# Patient Record
Sex: Male | Born: 1960
Health system: Southern US, Community
[De-identification: ages and names within clinical notes are randomized; demographics above are authoritative.]

## PROBLEM LIST (undated history)

## (undated) DIAGNOSIS — J449 Chronic obstructive pulmonary disease, unspecified: Secondary | ICD-10-CM

## (undated) DIAGNOSIS — C61 Malignant neoplasm of prostate: Secondary | ICD-10-CM

## (undated) DIAGNOSIS — Z720 Tobacco use: Secondary | ICD-10-CM

## (undated) DIAGNOSIS — I700 Atherosclerosis of aorta: Secondary | ICD-10-CM

## (undated) DIAGNOSIS — I2510 Atherosclerotic heart disease of native coronary artery without angina pectoris: Secondary | ICD-10-CM

## (undated) DIAGNOSIS — I10 Essential (primary) hypertension: Secondary | ICD-10-CM

## (undated) DIAGNOSIS — E785 Hyperlipidemia, unspecified: Secondary | ICD-10-CM

## (undated) DIAGNOSIS — C801 Malignant (primary) neoplasm, unspecified: Secondary | ICD-10-CM

## (undated) DIAGNOSIS — T7840XA Allergy, unspecified, initial encounter: Secondary | ICD-10-CM

## (undated) HISTORY — DX: Allergy, unspecified, initial encounter: T78.40XA

---

## 2008-12-30 ENCOUNTER — Emergency Department (HOSPITAL_COMMUNITY): Admission: EM | Admit: 2008-12-30 | Discharge: 2008-12-30 | Payer: Self-pay | Admitting: Family Medicine

## 2009-01-03 ENCOUNTER — Emergency Department (HOSPITAL_COMMUNITY): Admission: EM | Admit: 2009-01-03 | Discharge: 2009-01-03 | Payer: Self-pay | Admitting: Emergency Medicine

## 2009-11-22 ENCOUNTER — Emergency Department (HOSPITAL_COMMUNITY): Admission: EM | Admit: 2009-11-22 | Discharge: 2009-11-22 | Payer: Self-pay | Admitting: Emergency Medicine

## 2014-09-26 ENCOUNTER — Encounter (HOSPITAL_COMMUNITY): Payer: Self-pay | Admitting: Emergency Medicine

## 2014-09-26 ENCOUNTER — Emergency Department (INDEPENDENT_AMBULATORY_CARE_PROVIDER_SITE_OTHER)
Admission: EM | Admit: 2014-09-26 | Discharge: 2014-09-26 | Disposition: A | Discharge status: Home / Self Care | Payer: Self-pay | Source: Home / Self Care | Attending: Family Medicine | Admitting: Family Medicine

## 2014-09-26 DIAGNOSIS — M778 Other enthesopathies, not elsewhere classified: Secondary | ICD-10-CM

## 2014-09-26 DIAGNOSIS — T148XXA Other injury of unspecified body region, initial encounter: Secondary | ICD-10-CM

## 2014-09-26 DIAGNOSIS — M6530 Trigger finger, unspecified finger: Secondary | ICD-10-CM

## 2014-09-26 DIAGNOSIS — X503XXA Overexertion from repetitive movements, initial encounter: Secondary | ICD-10-CM

## 2014-09-26 DIAGNOSIS — T148 Other injury of unspecified body region: Secondary | ICD-10-CM

## 2014-09-26 MED ORDER — PREDNISONE 10 MG (48) PO TBPK: ORAL_TABLET | Freq: Every day | ORAL | Status: DC

## 2014-09-26 NOTE — ED Notes (Signed)
Pt states that his hand and wrist have been hurting and swollen for almost a week.

## 2014-09-26 NOTE — ED Provider Notes (Signed)
Austin Hawkins is a 54 y.o. male who presents to Urgent Care today for right hand and wrist pain. Patient notes a weeklong history of wrist pain and hand pain. 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 He notes his wrist pain is ulnar and worse with wrist extension and ulnar deviation. The hand pain is located at the third MCP pain is worse with flexion of his finger. He describes a popping sensation when he attempts to straighten his hand. He denies any radiating pain weakness or numbness. He denies any injury. He has tried ice and Advil which have helped only a little. No fevers or chills nausea vomiting or diarrhea.   History reviewed. No pertinent past medical history. History reviewed. No pertinent past surgical history. History  Substance Use Topics  . Smoking status: Current Every Day Smoker -- 1.00 packs/day    Types: Cigarettes  . Smokeless tobacco: Not on file  . Alcohol Use: Yes   ROS as above Medications: No current facility-administered medications for this encounter.   Current Outpatient Prescriptions  Medication Sig Dispense Refill  . predniSONE (STERAPRED UNI-PAK 48 TAB) 10 MG (48) TBPK tablet Take by mouth daily. 48 tablet 0   Allergies  Allergen Reactions  . Wool Alcohol [Lanolin]      Exam:  BP 125/90 mmHg  Pulse 65  Temp(Src) 98 F (36.7 C) (Oral)  Resp 16  SpO2 96% Gen: Well NAD HEENT: EOMI,  MMM Lungs: Normal work of breathing. CTABL Heart: RRR no MRG Abd: NABS, Soft. Nondistended, Nontender Exts: Brisk capillary refill, warm and well perfused.  Right arm:  Elbow nontender normal motion Wrist normal-appearing's with slight swelling at the ulnar aspect of the wrist. Tender palpation at the ulnar dorsal and volar wrist. Significant pain with wrist extension and ulnar deviation. Moderate pain with wrist flexion and ulnar deviation. No pain with wrist in plain ulnar deviation or radial deviation of the wrist. The snuffbox  is nontender. Hand is finger clubbing throughout Patient has triggering of the third digit with flexion of the PIP. Mild pain to palpation at the third volar MCP. Pulses and capillary refill and sensation are intact to the hand and wrist  Left arm: Elbow normal-appearing nontender normal motion Wrist normal-appearing nontender normal motion Hand clubbing of the fingers nontender normal motion pulses capillary refill sensation intact  No results found for this or any previous visit (from the past 24 hour(s)). No results found.  Assessment and Plan: 54 y.o. male with  1) trigger finger of the right third digit. Treat with a double Band-Aid splint rest and prednisone dose pack 2) tendinitis of the extensor carpi ulnaris likely: Treat with prednisone, thumb spica splint to prevent wrist motion, and attempted to change job duties at [employer] if able.  Follow-up with orthopedics as needed  Discussed warning signs or symptoms. Please see discharge instructions. Patient expresses understanding.     Gregor Hams, MD 09/26/14 [phone]

## 2014-09-26 NOTE — Discharge Instructions (Signed)
Thank you for coming in today.   Extensor Carpi Ulnaris Tendinitis with Rehab Tendonitis involves inflammation of a tendon, which is a soft tissue that connects muscle to bone. The Extensor Carpi Ulnaris (ECU) tendon is vulnerable to tendonitis. The ECU is responsible for straightening and rotating (abducting) the wrist. The ECU is important for gripping and pulling. ECU tendonitis may include inflammation of the ECU tendon lining (sheath). The tendon sheath usually secretes a fluid that allows the tendon to function smoothly, but when it becomes inflamed, function is impaired. This condition may also include a tear in the ECU tendon or muscle (strain). The strain may be classified as a grade 1 or 2 strain. Grade 1 strains cause pain, but the tendon is not lengthened. Grade 2 strains include a lengthened ligament, due to being stretched or partially ruptured. With grade 2 strains there is still function, although the function may be reduced. SYMPTOMS   Pain, tenderness, swelling, warmth, or redness on the little finger side of the wrist.  Pain that worsens when straightening the wrist or bending it toward the little finger.  Pain with gripping.  Limited motion of the wrist.  Crackling sound (crepitation) when the tendon or wrist is moved or touched. CAUSES  ECU tendonitis is caused by injury to the ECU tendon. It is usually due to chronic or repetitive injuries, but may be due to sharp (acute) injury. Common causes of injury include:  Strain from unusual use, overuse, or increase in activity, or change in activity of the wrist, hand, or forearm.  Direct hit (trauma) to the muscles and tendon on the side of the wrist.  Repetitive motions of the hand and wrist, due to friction of the tendon within the tendon lining. RISK INCREASES WITH:  Sports that involve repetitive hand and wrist motions (i.e. golfing, bowling).  Sports that require gripping (i.e. tennis, golf, weightlifting).  Heavy  labor.  Poor wrist and forearm strength and flexibility.  Failure to warm-up properly before activity. PREVENTION   Warm up and stretch properly before activity.  Allow the body to recover between activities.  Maintain physical fitness:  Strength, flexibility, and endurance.  Cardiovascular fitness.  Learn and use proper exercise technique. PROGNOSIS  If treated properly, ECU tendonitis is usually curable within 6 weeks.  RELATED COMPLICATIONS   Longer healing time, if not properly treated or if not given adequate time to heal.  Chronically inflamed tendon, causing persistent pain with activity. This may progress to constant pain, restriction of motion, and potentially tearing of the tendon.  Recurring symptoms, especially if activity is resumed too soon.  Risks of surgery: infection, bleeding, injury to nerves, continued pain, incomplete release of the tendon lining, recurring symptoms, cutting of the tendon, and weakness of the wrist and grip. TREATMENT  Treatment initially involves the use of ice and medicine, to help reduce pain and inflammation. Performing stretching and strengthening exercises regularly is important for a quick recovery. These exercises may be completed at home or with a therapist. Your caregiver may recommend the use of a brace or splint to reduce motions that aggravate symptoms. Corticosteroid injections may be recommended. If non-surgical treatment is unsuccessful, then surgery may be needed.  MEDICATION   If pain medicine is needed, nonsteroidal anti-inflammatory medicines (aspirin and ibuprofen), or other minor pain relievers (acetaminophen), are often recommended.  Do not take pain medicine for 7 days before surgery.  Prescription pain relievers may be given if your caregiver thinks they are needed. Use only as  directed and only as much as you need.  Corticosteroid injections may be given to reduce inflammation. However, these injections should be  reserved for serious cases, as they may only be given a certain number of times. COLD THERAPY  Cold treatment (icing) relieves pain and reduces inflammation. Cold treatment should be applied for 10 to 15 minutes every 2 to 3 hours, and immediately after activity that aggravates your symptoms. Use ice packs or an ice massage. SEEK MEDICAL CARE IF:   Symptoms get worse or do not improve in 2 weeks, despite treatment.  You experience pain, numbness, or coldness in the hand.  Blue, gray, or dark color appears in the fingernails.  Any of the following occur after surgery: increased pain, swelling, redness, drainage of fluids, bleeding in the surgical area, or signs of infection.  New, unexplained symptoms develop. (Drugs used in treatment may produce side effects.) EXERCISES RANGE OF MOTION (ROM) AND STRETCHING EXERCISES - Extensor Carpi Ulnaris Tendinitis These are some of the initial exercises you may start your recovery program with, until you see your caregiver again or until your symptoms are resolved. Remember:  Flexible tissue is more tolerant of the stresses placed on it during activity.  Each stretch should be held for 20 to 30 seconds.  A gentle stretching sensation should be felt. RANGE OF MOTION - Wrist Flexion  Hold your right / left wrist with the fingers pointing down toward the floor.  Pull down on the wrist until you feel a stretch.  Hold this position for __________ seconds. Repeat exercise __________ times, __________ times per day.  This exercise should be done with the elbow: _____ Bent to 90 degrees. _____ Straight. RANGE OF MOTION - Wrist Flexion  Place the back of your right / left hand flat on the top of a table. Your shoulder should be turned in and your fingers facing away from your body.  Press down, bending your wrist and straightening your elbow until your feel a stretch.  Hold this position for __________ seconds.  Repeat exercise __________ times,  __________ times per day. STRENGTHENING EXERCISES - Extensor Carpi Ulnaris Tendinitis These are some of the initial exercises you may start your recovery program with, until you see your caregiver again or until your symptoms are resolved. Remember:  Strong muscles with good endurance tolerate stress better.  Do the exercises as initially prescribed by your caregiver. Progress slowly with each exercise, gradually increasing the number of repetitions and weight used, only as guided. RANGE OF MOTION - Wrist Extensors  Sit or stand with your forearm supported.  Using a __________ weight or a piece of rubber band or tubing, bend your wrist slowly upward toward you.  Hold this position for __________ seconds and then slowly lower the wrist back to the starting position.  Repeat exercise __________ times, __________ times per day. RANGE OF MOTION - Wrist, Ulnar Deviation  Stand with a hammer in your hand, or sit while holding onto a rubber band or tubing with both hands, and with your arm supported on a table.  Raise your hand with the hammer upward behind you, or pull down on the rubber tubing.  Hold this position for __________ seconds and then slowly lower the wrist back to the starting position.  Repeat exercise __________ times, __________ times per day. STRENGTH - Grip  Hold a wad of putty, soft modeling clay, a large sponge, a soft rubber ball or a soft tennis ball in your hand.  Squeeze as  hard as you can.  Hold this position for __________ seconds.  Repeat exercise __________ times, __________ times per day. Document Released: 04/29/2005 Document Revised: 07/22/2011 Document Reviewed: 08/11/2008 [facility] Patient Information 2015 [place], [place]. This information is not intended to replace advice given to you by your health care provider. Make sure you discuss any questions you have with your health care provider.   Trigger Finger Trigger finger (digital tendinitis and  stenosing tenosynovitis) is a common disorder that causes an often painful catching of the fingers or thumb. It occurs as a clicking, snapping, or locking of a finger in the palm of the hand. This is caused by a problem with the tendons that flex or bend the fingers sliding smoothly through their sheaths. The condition may occur in any finger or a couple fingers at the same time.  The finger may lock with the finger curled or suddenly straighten out with a snap. This is more common in patients with rheumatoid arthritis and diabetes. Left untreated, the condition may get worse to the point where the finger becomes locked in flexion, like making a fist, or less commonly locked with the finger straightened out. CAUSES   Inflammation and scarring that lead to swelling around the tendon sheath.  Repeated or forceful movements.  Rheumatoid arthritis, an autoimmune disease that affects joints.  Gout.  Diabetes mellitus. SIGNS AND SYMPTOMS  Soreness and swelling of your finger.  A painful clicking or snapping as you bend and straighten your finger. DIAGNOSIS  Your health care provider will do a physical exam of your finger to diagnose trigger finger. TREATMENT   Splinting for 6-8 weeks may be helpful.  Nonsteroidal anti-inflammatory medicines (NSAIDs) can help to relieve the pain and inflammation.  Cortisone injections, along with splinting, may speed up recovery. Several injections may be required. Cortisone may give relief after one injection.  Surgery is another treatment that may be used if conservative treatments do not work. Surgery can be minor, without incisions (a cut does not have to be made), and can be done with a needle through the skin.  Other surgical choices involve an open procedure in which the surgeon opens the hand through a small incision and cuts the pulley so the tendon can again slide smoothly. Your hand will still work fine. HOME CARE INSTRUCTIONS  Apply ice to the  injured area, twice per day:  Put ice in a plastic bag.  Place a towel between your skin and the bag.  Leave the ice on for 20 minutes, 3-4 times a day.  Rest your hand often. MAKE SURE YOU:   Understand these instructions.  Will watch your condition.  Will get help right away if you are not doing well or get worse. Document Released: 02/17/2004 Document Revised: 12/30/2012 Document Reviewed: 09/29/2012 [facility] Patient Information 2015 [place], [place]. This information is not intended to replace advice given to you by your health care provider. Make sure you discuss any questions you have with your health care provider.

## 2015-08-29 ENCOUNTER — Ambulatory Visit (INDEPENDENT_AMBULATORY_CARE_PROVIDER_SITE_OTHER): Payer: BLUE CROSS/BLUE SHIELD | Admitting: Family Medicine

## 2015-08-29 VITALS — BP 126/82 | HR 86 | Temp 98.2°F | Resp 18 | Ht 67.0 in | Wt 122.0 lb

## 2015-08-29 DIAGNOSIS — Z0000 Encounter for general adult medical examination without abnormal findings: Secondary | ICD-10-CM

## 2015-08-29 DIAGNOSIS — Z23 Encounter for immunization: Secondary | ICD-10-CM

## 2015-08-29 DIAGNOSIS — T7840XA Allergy, unspecified, initial encounter: Secondary | ICD-10-CM | POA: Diagnosis not present

## 2015-08-29 LAB — POCT CBC
Granulocyte percent: 44.6 %G (ref 37–80)
HCT, POC: 35.3 % — AB (ref 43.5–53.7)
Hemoglobin: 11.9 g/dL — AB (ref 14.1–18.1)
Lymph, poc: 2.1 (ref 0.6–3.4)
MCH, POC: 29.6 pg (ref 27–31.2)
MCHC: 33.8 g/dL (ref 31.8–35.4)
MCV: 87.4 fL (ref 80–97)
MID (cbc): 0.6 (ref 0–0.9)
MPV: 7.3 fL (ref 0–99.8)
POC Granulocyte: 2.1 (ref 2–6.9)
POC LYMPH PERCENT: 43.6 %L (ref 10–50)
POC MID %: 11.8 %M (ref 0–12)
Platelet Count, POC: 252 10*3/uL (ref 142–424)
RBC: 4.03 M/uL — AB (ref 4.69–6.13)
RDW, POC: 14.1 %
WBC: 4.8 10*3/uL (ref 4.6–10.2)

## 2015-08-29 LAB — POCT URINALYSIS DIP (MANUAL ENTRY)
Bilirubin, UA: NEGATIVE
Glucose, UA: NEGATIVE
Leukocytes, UA: NEGATIVE
Nitrite, UA: NEGATIVE
Protein Ur, POC: 30 — AB
Spec Grav, UA: 1.025
Urobilinogen, UA: 0.2
pH, UA: 5.5

## 2015-08-29 MED ORDER — PREDNISONE 20 MG PO TABS: 20.0000 mg | ORAL_TABLET | Freq: Every day | ORAL | Status: DC | PRN

## 2015-08-29 NOTE — Patient Instructions (Addendum)

## 2015-08-29 NOTE — Progress Notes (Signed)
Patient ID: Austin Hawkins MRN: [id number], DOB: Apr 09, 1961 55 y.o. Date of Encounter: 08/29/2015, 5:24 PM  Primary Physician: No primary care provider on file.  Chief Complaint: Physical (CPE)  HPI: 55 y.o. y/o male with history noted below here for CPE.  He has an ex-wife and a former partner from which she has a son and a daughter. The son lives in [place] and the daughter in [place].   Plays basketball, rents a room. He's been working for [employer] for about 18 months. He occasionally has to deal with mineral wool which causes him to breakout, get itchy eyes. He has taken Benadryl which has helped somewhat. His company wants him to provide a letter stating that he can continue to work despite this allergy.  Patient has not had health insurance until recently. Needs to wait 6 more months before he qualifies for dental. He plans to then go to the dentist.  He smokes and is trying to quit.  Review of Systems: Consitutional: No fever, chills, fatigue, night sweats, lymphadenopathy, or weight changes. Eyes: No visual changes, eye redness, or discharge. ENT/Mouth: Ears: No otalgia, tinnitus, hearing loss, discharge. Nose: No congestion, rhinorrhea, sinus pain, or epistaxis. Throat: No sore throat, post nasal drip, or teeth pain. Cardiovascular: No CP, palpitations, diaphoresis, DOE, edema, orthopnea, PND. Respiratory: No cough, hemoptysis, SOB, or wheezing. Gastrointestinal: No anorexia, dysphagia, reflux, pain, nausea, vomiting, hematemesis, diarrhea, constipation, BRBPR, or melena. Genitourinary: No dysuria, frequency, urgency, hematuria, incontinence, nocturia, decreased urinary stream, discharge, impotence, or testicular pain/masses. Musculoskeletal: No decreased ROM, myalgias, stiffness, joint swelling, or weakness. Skin: No rash, erythema, lesion changes, pain, warmth, jaundice, or pruritis. Neurological: No headache, dizziness, syncope, seizures, tremors,  memory loss, coordination problems, or paresthesias. Psychological: No anxiety, depression, hallucinations, SI/HI. Endocrine: No fatigue, polydipsia, polyphagia, polyuria, or known diabetes. All other systems were reviewed and are otherwise negative.  History reviewed. No pertinent past medical history.   History reviewed. No pertinent past surgical history.  Home Meds:  Prior to Admission medications   Medication Sig Start Date End Date Taking? Authorizing Provider  predniSONE (STERAPRED UNI-PAK 48 TAB) 10 MG (48) TBPK tablet Take by mouth daily. Patient not taking: Reported on 08/29/2015 09/26/14   Gregor Hams, MD    Allergies:  Allergies  Allergen Reactions  . Wool Alcohol [Lanolin]     Social History   Social History  . Marital Status: Single    Spouse Name: N/A  . Number of Children: N/A  . Years of Education: N/A   Occupational History  . Not on file.   Social History Main Topics  . Smoking status: Current Every Day Smoker -- 1.00 packs/day    Types: Cigarettes  . Smokeless tobacco: Not on file  . Alcohol Use: Yes  . Drug Use: No  . Sexual Activity: Not on file   Other Topics Concern  . Not on file   Social History Narrative    History reviewed. No pertinent family history.  Physical Exam: Blood pressure 126/82, pulse 86, temperature 98.2 F (36.8 C), temperature source Oral, resp. rate 18, height 5\' 7"  (1.702 m), weight 122 lb (55.339 kg), SpO2 96 %.  BP Readings from Last 3 Encounters:  08/29/15 126/82  09/26/14 125/90   General: Well developed, well nourished, in no acute distress. HEENT: Normocephalic, atraumatic. Conjunctiva pink, sclera non-icteric. Pupils 2 mm constricting to 1 mm, round, regular, and equally reactive to light and accomodation. EOMI.  Fundi benign   Internal auditory  canal clear. TMs with good cone of light and without pathology. Nasal mucosa pink. Nares are without discharge. No sinus tenderness. Oral mucosa pink. Dentition  fair--> receding gums . Pharynx without exudate.    Neck: Supple. Trachea midline. No thyromegaly. Full ROM. No lymphadenopathy. Lungs: Clear to auscultation bilaterally without wheezes, rales, or rhonchi. Breathing is of normal effort and unlabored. Cardiovascular: RRR with S1 S2. No murmurs, rubs, or gallops appreciated. Distal pulses 2+ symmetrically. No carotid or abdominal bruits Abdomen: Soft, non-tender, non-distended with normoactive bowel sounds. No hepatosplenomegaly or masses. No rebound/guarding. No CVA tenderness. Without hernias.   Genitourinary:  circumcised male. No penile lesions. Testes descended bilaterally, and smooth without tenderness or masses.  Musculoskeletal: Full range of motion and 5/5 strength throughout. Without swelling, atrophy, tenderness, crepitus, or warmth. Extremities without clubbing, cyanosis, or edema. Calves supple. Skin: Warm and moist without erythema, ecchymosis, wounds, or rash. Neuro: A+Ox3. CN II-XII grossly intact. Moves all extremities spontaneously. Full sensation throughout. Normal gait. DTR 2+ throughout upper and lower extremities. Finger to nose intact. Psych:  Responds to questions appropriately with a normal affect.   No results found for: CHOL No results found for: HDL No results found for: LDLCALC No results found for: TRIG No results found for: CHOLHDL No results found for: LDLDIRECT  Assessment/Plan:  55 y.o. y/o  male here for CPE   ICD-9-CM ICD-10-CM   1. Annual physical exam V70.0 Z00.00 Ambulatory referral to Gastroenterology     Lipid panel     Comprehensive metabolic panel     POCT urinalysis dipstick     POCT CBC     PSA     EKG 12-Lead  2. Allergy, initial encounter 995.3 T78.40XA predniSONE (DELTASONE) 20 MG tablet    Signed, Robyn Haber, MD 08/29/2015 5:24 PM

## 2015-08-30 LAB — COMPREHENSIVE METABOLIC PANEL WITH GFR
ALT: 18 U/L (ref 9–46)
AST: 31 U/L (ref 10–35)
Albumin: 4.5 g/dL (ref 3.6–5.1)
Alkaline Phosphatase: 42 U/L (ref 40–115)
BUN: 12 mg/dL (ref 7–25)
CO2: 22 mmol/L (ref 20–31)
Calcium: 9.1 mg/dL (ref 8.6–10.3)
Chloride: 102 mmol/L (ref 98–110)
Creat: 1.06 mg/dL (ref 0.70–1.33)
Glucose, Bld: 111 mg/dL — ABNORMAL HIGH (ref 65–99)
Potassium: 4.4 mmol/L (ref 3.5–5.3)
Sodium: 137 mmol/L (ref 135–146)
Total Bilirubin: 0.5 mg/dL (ref 0.2–1.2)
Total Protein: 8 g/dL (ref 6.1–8.1)

## 2015-08-30 LAB — LIPID PANEL
Cholesterol: 157 mg/dL (ref 125–200)
HDL: 69 mg/dL (ref >=40)
LDL Cholesterol: 64 mg/dL (ref <130)
Total CHOL/HDL Ratio: 2.3 Ratio (ref <=5.0)
Triglycerides: 122 mg/dL (ref <150)
VLDL: 24 mg/dL (ref <30)

## 2015-08-31 ENCOUNTER — Other Ambulatory Visit: Payer: Self-pay | Admitting: Family Medicine

## 2015-08-31 DIAGNOSIS — R972 Elevated prostate specific antigen [PSA]: Secondary | ICD-10-CM

## 2015-08-31 LAB — PSA: PSA: 17.67 ng/mL — ABNORMAL HIGH (ref <=4.00)

## 2016-08-08 ENCOUNTER — Ambulatory Visit (INDEPENDENT_AMBULATORY_CARE_PROVIDER_SITE_OTHER): Payer: 59

## 2016-08-08 ENCOUNTER — Ambulatory Visit (INDEPENDENT_AMBULATORY_CARE_PROVIDER_SITE_OTHER): Payer: 59 | Admitting: Emergency Medicine

## 2016-08-08 VITALS — BP 127/77 | HR 89 | Temp 98.8°F | Resp 18 | Ht 66.54 in | Wt 123.8 lb

## 2016-08-08 DIAGNOSIS — M25561 Pain in right knee: Secondary | ICD-10-CM | POA: Diagnosis not present

## 2016-08-08 DIAGNOSIS — Z23 Encounter for immunization: Secondary | ICD-10-CM

## 2016-08-08 DIAGNOSIS — M25461 Effusion, right knee: Secondary | ICD-10-CM | POA: Diagnosis not present

## 2016-08-08 DIAGNOSIS — M7051 Other bursitis of knee, right knee: Secondary | ICD-10-CM

## 2016-08-08 MED ORDER — DICLOFENAC SODIUM 75 MG PO TBEC
75.0000 mg | DELAYED_RELEASE_TABLET | Freq: Two times a day (BID) | ORAL | 0 refills | Status: AC
Start: 2016-08-08 — End: 2016-08-13

## 2016-08-08 MED ORDER — PREDNISONE 20 MG PO TABS: 40.0000 mg | ORAL_TABLET | Freq: Every day | ORAL | 0 refills | Status: AC

## 2016-08-08 NOTE — Progress Notes (Signed)
Austin Hawkins 56 y.o.   Chief Complaint  Patient presents with  . Knee Pain    swollen right knee x 2 weeks   . Flu Vaccine    HISTORY OF PRESENT ILLNESS: This is a 56 y.o. male complaining of right knee swelling x 2 weeks; denies injury.  Knee Pain   The incident occurred more than 1 week ago. There was no injury mechanism. The pain is present in the right knee. The quality of the pain is described as aching. The pain is at a severity of 6/10. The pain is moderate. The pain has been fluctuating since onset. Pertinent negatives include no loss of motion, loss of sensation, muscle weakness, numbness or tingling. Associated symptoms comments: Painful to bear weight.. The symptoms are aggravated by weight bearing and movement.     Prior to Admission medications   Medication Sig Start Date End Date Taking? Authorizing Provider  diclofenac (VOLTAREN) 75 MG EC tablet Take 1 tablet (75 mg total) by mouth 2 (two) times daily. 08/08/16 08/13/16  Horald Pollen, MD  predniSONE (DELTASONE) 20 MG tablet Take 2 tablets (40 mg total) by mouth daily with breakfast. 08/08/16 08/13/16  Horald Pollen, MD    Allergies  Allergen Reactions  . Wool Alcohol [Lanolin]     There are no active problems to display for this patient.   No past medical history on file.  No past surgical history on file.  Social History   Social History  . Marital status: Single    Spouse name: N/A  . Number of children: N/A  . Years of education: N/A   Occupational History  . Not on file.   Social History Main Topics  . Smoking status: Current Every Day Smoker    Packs/day: 1.00    Types: Cigarettes  . Smokeless tobacco: Never Used  . Alcohol use Yes  . Drug use: No  . Sexual activity: Not on file   Other Topics Concern  . Not on file   Social History Narrative  . No narrative on file    No family history on file.   Review of Systems  Constitutional: Negative for chills and fever.    HENT: Negative.   Eyes: Negative.   Respiratory: Negative for shortness of breath.   Cardiovascular: Negative for chest pain, claudication and leg swelling.  Gastrointestinal: Negative for abdominal pain, nausea and vomiting.  Genitourinary: Negative for dysuria and hematuria.  Musculoskeletal: Positive for joint pain (right knee).  Skin: Negative for rash.  Neurological: Negative for dizziness, tingling, sensory change, focal weakness and numbness.  Endo/Heme/Allergies: Negative.   All other systems reviewed and are negative.  Vitals:   08/08/16 1714  BP: 127/77  Pulse: 89  Resp: 18  Temp: 98.8 F (37.1 C)     Physical Exam  Constitutional: He is oriented to person, place, and time. He appears well-developed and well-nourished.  HENT:  Head: Normocephalic and atraumatic.  Eyes: EOM are normal. Pupils are equal, round, and reactive to light.  Neck: Normal range of motion. Neck supple.  Cardiovascular: Normal rate and regular rhythm.   Pulmonary/Chest: Effort normal and breath sounds normal.  Abdominal: Soft. There is no tenderness.  Musculoskeletal:       Right knee: He exhibits swelling and effusion. He exhibits normal range of motion, no ecchymosis, no deformity, no erythema, normal alignment, no LCL laxity, normal patellar mobility and no bony tenderness. No tenderness found.  Neurological: He is alert and oriented to person,  place, and time. He displays normal reflexes. No sensory deficit. He exhibits normal muscle tone.  Skin: Skin is warm and dry. Capillary refill takes less than 2 seconds.  Psychiatric: He has a normal mood and affect. His behavior is normal.  Vitals reviewed.  Xray reviewed by me: no bony abnormality.  ASSESSMENT & PLAN: Austin Hawkins was seen today for knee pain and flu vaccine.  Diagnoses and all orders for this visit:  Acute pain of right knee -     DG Knee 1-2 Views Right; Future -     Ambulatory referral to Orthopedic Surgery  Bursitis of  right knee, unspecified bursa -     Ambulatory referral to Orthopedic Surgery  Other orders -     diclofenac (VOLTAREN) 75 MG EC tablet; Take 1 tablet (75 mg total) by mouth 2 (two) times daily. -     predniSONE (DELTASONE) 20 MG tablet; Take 2 tablets (40 mg total) by mouth daily with breakfast.     Patient Instructions       IF you received an x-ray today, you will receive an invoice from [facility]. Please contact [facility] at [phone] with questions or concerns regarding your invoice.   IF you received labwork today, you will receive an invoice from [facility]. Please contact [facility] at [phone] with questions or concerns regarding your invoice.   Our billing staff will not be able to assist you with questions regarding bills from these companies.  You will be contacted with the lab results as soon as they are available. The fastest way to get your results is to activate your My Chart account. Instructions are located on the last page of this paperwork. If you have not heard from [place] regarding the results in 2 weeks, please contact this office.      Pes Anserine Bursitis The pes anserine is an area on the inside of your knee, just below the joint, which is cushioned by a fluid-filled sac (bursa). Pes anserine bursitis is a condition that happens when this bursa gets swollen and irritated. The condition causes knee pain. What are the causes? This condition may be caused by:  Making the same movement over and over.  A hit to the inside of the leg. What increases the risk? This injury is most likely to develop in:  Runners.  Athletes who play sports that involve a lot of running and quick side-to-side movements (cutting).  Athletes who play contact sports.  People who swim using an inward angle of the knee, such as with the breaststroke.  People with tight hamstring muscles.  Females.  People who are overweight.  People with flat  feet.  People who have diabetes or osteoarthritis. What are the signs or symptoms? Symptoms of this condition include:  Knee pain that gets better with rest and worse with activities like climbing stairs, walking, running, or getting in and out of a chair (common).  Swelling.  Warmth.  Tenderness when pressing at the inside of the lower leg, just below the knee. How is this diagnosed? This condition may be diagnosed based on:  Your symptoms.  Your medical history.  A physical exam.  Tests, such as:  X-rays.  MRI and ultrasound. These tests are done to check for swelling and fluid buildup in the bursa and to look at muscles and tendons. During your physical exam, your health care provider will press on the tendon attachment to see if you feel pain. He or she may also check your  hip and knee motion and strength. How is this treated? This condition may be treated by:  Resting your knee.  Avoiding activities that cause pain.  Icing the inside of your knee.  Raising (elevating) your knee while resting.  Sleeping with a pillow between your knees. This will cushion your injured knee.  Taking medicine to reduce pain and swelling.  Having medicines injected into your knee.  Doing strengthening and stretching exercises (physical therapy). If these treatments do not work or if the condition keeps coming back, you may need to have surgery to remove the bursa. Follow these instructions at home: Managing pain, stiffness, and swelling   If directed, apply ice to your knee.  Put ice in a plastic bag.  Place a towel between your skin and the bag.  Leave the ice on for 20 minutes, 2-3 times a day.  While you are sitting, elevate your knee.  While you are lying down, elevate your knee above the level of your heart.  Take over-the-counter and prescription medicines only as told by your health care provider. Activity   Return to your normal activities as told by your  health care provider. Ask your health care provider what activities are safe for you.  Do exercises as told by your health care provider. General instructions   Sleep with a pillow between your knees.  Do not use any tobacco products, such as cigarettes, chewing tobacco, and e-cigarettes. Tobacco can delay healing. If you need help quitting, ask your health care provider.  Keep all follow-up visits as told by your health care provider. This is important. How is this prevented?  Warm up and stretch before being active.  Cool down and stretch after being active.  Give your body time to rest between periods of activity.  Make sure to use equipment that fits you.  Be safe and responsible while being active to avoid falls.  Do at least 150 minutes of moderate-intensity exercise each week, such as brisk walking or water aerobics.  Maintain physical fitness, including:  Strength.  Flexibility.  Cardiovascular fitness.  Endurance. Contact a health care provider if:  Your symptoms do not improve.  Your symptoms get worse. This information is not intended to replace advice given to you by your health care provider. Make sure you discuss any questions you have with your health care provider. Document Released: 04/29/2005 Document Revised: 01/02/2016 Document Reviewed: 04/14/2015 Elsevier Interactive Patient Education  2017 Elsevier Inc.      Agustina Caroli, MD [facility]

## 2016-08-08 NOTE — Patient Instructions (Addendum)
IF you received an x-ray today, you will receive an invoice from [facility]. Please contact [facility] at [phone] with questions or concerns regarding your invoice.   IF you received labwork today, you will receive an invoice from [facility]. Please contact [facility] at [phone] with questions or concerns regarding your invoice.   Our billing staff will not be able to assist you with questions regarding bills from these companies.  You will be contacted with the lab results as soon as they are available. The fastest way to get your results is to activate your My Chart account. Instructions are located on the last page of this paperwork. If you have not heard from [place] regarding the results in 2 weeks, please contact this office.      Pes Anserine Bursitis The pes anserine is an area on the inside of your knee, just below the joint, which is cushioned by a fluid-filled sac (bursa). Pes anserine bursitis is a condition that happens when this bursa gets swollen and irritated. The condition causes knee pain. What are the causes? This condition may be caused by:  Making the same movement over and over.  A hit to the inside of the leg. What increases the risk? This injury is most likely to develop in:  Runners.  Athletes who play sports that involve a lot of running and quick side-to-side movements (cutting).  Athletes who play contact sports.  People who swim using an inward angle of the knee, such as with the breaststroke.  People with tight hamstring muscles.  Females.  People who are overweight.  People with flat feet.  People who have diabetes or osteoarthritis. What are the signs or symptoms? Symptoms of this condition include:  Knee pain that gets better with rest and worse with activities like climbing stairs, walking, running, or getting in and out of a chair (common).  Swelling.  Warmth.  Tenderness when pressing at the inside of the  lower leg, just below the knee. How is this diagnosed? This condition may be diagnosed based on:  Your symptoms.  Your medical history.  A physical exam.  Tests, such as:  X-rays.  MRI and ultrasound. These tests are done to check for swelling and fluid buildup in the bursa and to look at muscles and tendons. During your physical exam, your health care provider will press on the tendon attachment to see if you feel pain. He or she may also check your hip and knee motion and strength. How is this treated? This condition may be treated by:  Resting your knee.  Avoiding activities that cause pain.  Icing the inside of your knee.  Raising (elevating) your knee while resting.  Sleeping with a pillow between your knees. This will cushion your injured knee.  Taking medicine to reduce pain and swelling.  Having medicines injected into your knee.  Doing strengthening and stretching exercises (physical therapy). If these treatments do not work or if the condition keeps coming back, you may need to have surgery to remove the bursa. Follow these instructions at home: Managing pain, stiffness, and swelling   If directed, apply ice to your knee.  Put ice in a plastic bag.  Place a towel between your skin and the bag.  Leave the ice on for 20 minutes, 2-3 times a day.  While you are sitting, elevate your knee.  While you are lying down, elevate your knee above the level of your heart.  Take over-the-counter and prescription  medicines only as told by your health care provider. Activity   Return to your normal activities as told by your health care provider. Ask your health care provider what activities are safe for you.  Do exercises as told by your health care provider. General instructions   Sleep with a pillow between your knees.  Do not use any tobacco products, such as cigarettes, chewing tobacco, and e-cigarettes. Tobacco can delay healing. If you need help quitting,  ask your health care provider.  Keep all follow-up visits as told by your health care provider. This is important. How is this prevented?  Warm up and stretch before being active.  Cool down and stretch after being active.  Give your body time to rest between periods of activity.  Make sure to use equipment that fits you.  Be safe and responsible while being active to avoid falls.  Do at least 150 minutes of moderate-intensity exercise each week, such as brisk walking or water aerobics.  Maintain physical fitness, including:  Strength.  Flexibility.  Cardiovascular fitness.  Endurance. Contact a health care provider if:  Your symptoms do not improve.  Your symptoms get worse. This information is not intended to replace advice given to you by your health care provider. Make sure you discuss any questions you have with your health care provider. Document Released: 04/29/2005 Document Revised: 01/02/2016 Document Reviewed: 04/14/2015 Elsevier Interactive Patient Education  2017 [employer].

## 2016-08-08 NOTE — Addendum Note (Signed)
Addended by: Nickolas Madrid on: 08/08/2016 06:39 PM   Modules accepted: Orders

## 2016-08-20 ENCOUNTER — Ambulatory Visit (INDEPENDENT_AMBULATORY_CARE_PROVIDER_SITE_OTHER): Payer: 59 | Admitting: Student

## 2016-08-20 VITALS — BP 138/78 | HR 77 | Temp 98.9°F | Resp 16 | Ht 67.0 in | Wt 128.0 lb

## 2016-08-20 DIAGNOSIS — R3589 Other polyuria: Secondary | ICD-10-CM | POA: Insufficient documentation

## 2016-08-20 DIAGNOSIS — R972 Elevated prostate specific antigen [PSA]: Secondary | ICD-10-CM | POA: Insufficient documentation

## 2016-08-20 DIAGNOSIS — Z0000 Encounter for general adult medical examination without abnormal findings: Secondary | ICD-10-CM | POA: Diagnosis not present

## 2016-08-20 DIAGNOSIS — Z114 Encounter for screening for human immunodeficiency virus [HIV]: Secondary | ICD-10-CM | POA: Insufficient documentation

## 2016-08-20 DIAGNOSIS — Z113 Encounter for screening for infections with a predominantly sexual mode of transmission: Secondary | ICD-10-CM | POA: Insufficient documentation

## 2016-08-20 DIAGNOSIS — R358 Other polyuria: Secondary | ICD-10-CM | POA: Diagnosis not present

## 2016-08-20 DIAGNOSIS — N029 Recurrent and persistent hematuria with unspecified morphologic changes: Secondary | ICD-10-CM | POA: Diagnosis not present

## 2016-08-20 DIAGNOSIS — Z1211 Encounter for screening for malignant neoplasm of colon: Secondary | ICD-10-CM

## 2016-08-20 DIAGNOSIS — R630 Anorexia: Secondary | ICD-10-CM | POA: Diagnosis not present

## 2016-08-20 LAB — POCT URINALYSIS DIP (MANUAL ENTRY)
BILIRUBIN UA: NEGATIVE
GLUCOSE UA: NEGATIVE mg/dL
Leukocytes, UA: NEGATIVE — AB
NITRITE UA: NEGATIVE
Protein Ur, POC: NEGATIVE mg/dL
Spec Grav, UA: 1.02 (ref 1.010–1.025)
Urobilinogen, UA: 1 E.U./dL
pH, UA: 6 (ref 5.0–8.0)

## 2016-08-20 LAB — IFOBT (OCCULT BLOOD): IFOBT: NEGATIVE

## 2016-08-20 NOTE — Assessment & Plan Note (Signed)
Referral for urology.  Blood in urine twice, referral to urology for bladder cancer workup.

## 2016-08-20 NOTE — Assessment & Plan Note (Addendum)
Needs recheck PSA and will refer to urology. Expressed concern for prostate cancer.

## 2016-08-20 NOTE — Progress Notes (Signed)
Subjective:    Patient ID: Austin Hawkins, male    DOB: 1960/05/19, 56 y.o.   MRN: [id number]  HPI Presents for annual exam.  He has no chief complaints today.  Does state that he does urinate frequently.  No hesitancy, urgency, dysuria, hematuria.  Denies melena or hematochezia.  No penile discharge.  No fevers, chills, weight changes.  +smoking history for 40 years.  Sexually active, no symptoms of STD's. Would like screening.  Currently working, enjoys work.  Had an elevated PSA last year, but did not have a phone so he could not get results.  Drinks 1 glass of alcohol per day.    Was seen a couple weeks ago for right knee pain and swelling. He was giving prednisone and diclofenac with some relief.  Reports catching and buckling.  He denies any injury.  He has an appt with ortho in a couple weeks.  Tetanus shot 08/2015. Needs colonoscopy.  PMHx - Updated and reviewed.  Contributory factors include: Negative PSHx - Updated and reviewed.  Contributory factors include:  Negative FHx - Updated and reviewed.  Contributory factors include:  h/o stroke, CAD Social Hx - Updated and reviewed. Contributory factors include: Has a history  Medications - reviewed    Review of Systems  Constitutional: Negative for chills, fatigue and fever.  HENT: Negative for congestion, ear discharge, ear pain, rhinorrhea, sneezing, sore throat and voice change.   Eyes: Negative for pain and itching.  Respiratory: Negative for cough, shortness of breath, wheezing and stridor.   Cardiovascular: Negative for chest pain, palpitations and leg swelling.  Gastrointestinal: Negative for abdominal pain, blood in stool, constipation, diarrhea and nausea.  Endocrine: Positive for polyuria. Negative for cold intolerance, heat intolerance, polydipsia and polyphagia.  Genitourinary: Negative for discharge, dysuria, hematuria, penile pain and urgency.  Musculoskeletal: Negative for joint swelling and myalgias.  Skin: Negative  for pallor, rash and wound.  Neurological: Negative for dizziness and weakness.  Hematological: Negative for adenopathy. Does not bruise/bleed easily.  Psychiatric/Behavioral: Negative for behavioral problems, dysphoric mood, sleep disturbance and suicidal ideas. The patient is not nervous/anxious.   All other systems reviewed and are negative.      Objective:   Physical Exam  Constitutional: He is oriented to person, place, and time. He appears well-developed and well-nourished. No distress.  HENT:  Head: Normocephalic and atraumatic.  Right Ear: External ear normal.  Left Ear: External ear normal.  Nose: Nose normal.  Mouth/Throat: Oropharynx is clear and moist. No oropharyngeal exudate.  Eyes: Conjunctivae are normal. No scleral icterus.  Neck: Normal range of motion. Neck supple.  Cardiovascular: Normal rate, regular rhythm, normal heart sounds and intact distal pulses.  Exam reveals no gallop and no friction rub.   No murmur heard. Pulmonary/Chest: Effort normal and breath sounds normal. No respiratory distress. He has no wheezes. He has no rales.  Abdominal: Soft. Bowel sounds are normal. He exhibits no distension and no mass. There is no tenderness. There is no guarding.  Genitourinary:  Genitourinary Comments: Has small external hemorrhoids No bleeding seen Prostate with hypertrophy, no nodules palpated  Musculoskeletal: He exhibits no edema or deformity.  Lymphadenopathy:    He has no cervical adenopathy.  Neurological: He is alert and oriented to person, place, and time.  Skin: Skin is warm. No rash noted. He is not diaphoretic. No erythema. No pallor.  Psychiatric: He has a normal mood and affect. His behavior is normal. Judgment and thought content normal.   BP 138/78  Pulse 77   Temp 98.9 F (37.2 C) (Oral)   Resp 16   Ht 5\' 7"  (1.702 m)   Wt 128 lb (58.1 kg)   SpO2 99%   BMI 20.05 kg/m         Assessment & Plan:  Elevated PSA, between 10 and less than  20 ng/ml Needs recheck PSA and will refer to urology.  Polyuria Will screen DM, BMP.  Rechecking PSA.  Idiopathic hematuria Referral for urology.  Blood in urine twice, referral to urology for bladder cancer workup.  Screen for STD (sexually transmitted disease) Would like screening for STD's.  Signed,  Balinda Quails, DO [facility] 8:55 PM

## 2016-08-20 NOTE — Patient Instructions (Signed)
     IF you received an x-ray today, you will receive an invoice from [facility]. Please contact [facility] at [phone] with questions or concerns regarding your invoice.   IF you received labwork today, you will receive an invoice from [facility]. Please contact [facility] at [phone] with questions or concerns regarding your invoice.   Our billing staff will not be able to assist you with questions regarding bills from these companies.  You will be contacted with the lab results as soon as they are available. The fastest way to get your results is to activate your My Chart account. Instructions are located on the last page of this paperwork. If you have not heard from us regarding the results in 2 weeks, please contact this office.     

## 2016-08-20 NOTE — Assessment & Plan Note (Signed)
Will screen DM, BMP.  Rechecking PSA.

## 2016-08-20 NOTE — Assessment & Plan Note (Signed)
Would like screening for STD's.

## 2016-08-21 ENCOUNTER — Telehealth: Payer: Self-pay | Admitting: Student

## 2016-08-21 LAB — CBC WITH DIFFERENTIAL/PLATELET
BASOS ABS: 0 10*3/uL (ref 0.0–0.2)
BASOS: 1 %
EOS (ABSOLUTE): 0.1 10*3/uL (ref 0.0–0.4)
Eos: 2 %
HEMATOCRIT: 38.2 % (ref 37.5–51.0)
Hemoglobin: 12.2 g/dL — ABNORMAL LOW (ref 13.0–17.7)
IMMATURE GRANS (ABS): 0 10*3/uL (ref 0.0–0.1)
Immature Granulocytes: 0 %
LYMPHS ABS: 1.6 10*3/uL (ref 0.7–3.1)
LYMPHS: 34 %
MCH: 28.7 pg (ref 26.6–33.0)
MCHC: 31.9 g/dL (ref 31.5–35.7)
MCV: 90 fL (ref 79–97)
MONOS ABS: 1.1 10*3/uL — AB (ref 0.1–0.9)
Monocytes: 23 %
NEUTROS ABS: 1.8 10*3/uL (ref 1.4–7.0)
Neutrophils: 40 %
PLATELETS: 350 10*3/uL (ref 150–379)
RBC: 4.25 x10E6/uL (ref 4.14–5.80)
RDW: 13.8 % (ref 12.3–15.4)
WBC: 4.7 10*3/uL (ref 3.4–10.8)

## 2016-08-21 LAB — BASIC METABOLIC PANEL
BUN / CREAT RATIO: 14 (ref 9–20)
BUN: 15 mg/dL (ref 6–24)
CO2: 25 mmol/L (ref 18–29)
CREATININE: 1.07 mg/dL (ref 0.76–1.27)
Calcium: 9.3 mg/dL (ref 8.7–10.2)
Chloride: 101 mmol/L (ref 96–106)
GFR calc non Af Amer: 77 mL/min/{1.73_m2} (ref >59)
GFR, EST AFRICAN AMERICAN: 89 mL/min/{1.73_m2} (ref >59)
Glucose: 90 mg/dL (ref 65–99)
Potassium: 4.5 mmol/L (ref 3.5–5.2)
Sodium: 141 mmol/L (ref 134–144)

## 2016-08-21 LAB — PSA: Prostate Specific Ag, Serum: 19.2 ng/mL — ABNORMAL HIGH (ref 0.0–4.0)

## 2016-08-21 LAB — RPR: RPR Ser Ql: NONREACTIVE

## 2016-08-21 LAB — GC/CHLAMYDIA PROBE AMP
CHLAMYDIA, DNA PROBE: NEGATIVE
Neisseria gonorrhoeae by PCR: NEGATIVE

## 2016-08-21 LAB — HEMOGLOBIN A1C
Est. average glucose Bld gHb Est-mCnc: 100 mg/dL
Hgb A1c MFr Bld: 5.1 % (ref 4.8–5.6)

## 2016-08-21 LAB — HIV ANTIBODY (ROUTINE TESTING W REFLEX): HIV Screen 4th Generation wRfx: NONREACTIVE

## 2016-08-21 LAB — TSH: TSH: 1.53 u[IU]/mL (ref 0.450–4.500)

## 2016-08-21 LAB — HEPATITIS B SURFACE ANTIGEN: Hepatitis B Surface Ag: NEGATIVE

## 2016-08-21 NOTE — Telephone Encounter (Signed)
Called pt and left message that labs were as expected.  Can call back if would like to go over in further detail.  Only significant lab abnormality was the increase in PSA, but this was reviewed during his office visit that it is imperative to see urologist.  Concerned for prostate CA.  Signed,  Balinda Quails, DO [facility] 1:20 PM

## 2016-08-23 DIAGNOSIS — R972 Elevated prostate specific antigen [PSA]: Secondary | ICD-10-CM | POA: Diagnosis not present

## 2016-08-28 ENCOUNTER — Telehealth: Payer: Self-pay | Admitting: Student

## 2016-08-28 NOTE — Telephone Encounter (Signed)
Pt would like Dr Drema Dallas to know that he was seen by the provider at [facility] and they will be doing a follow up procedure on May 25th to check for prostate cancer.

## 2016-09-04 ENCOUNTER — Ambulatory Visit (INDEPENDENT_AMBULATORY_CARE_PROVIDER_SITE_OTHER): Payer: 59 | Admitting: Orthopaedic Surgery

## 2016-09-04 DIAGNOSIS — M25561 Pain in right knee: Secondary | ICD-10-CM | POA: Diagnosis not present

## 2016-09-04 DIAGNOSIS — M25461 Effusion, right knee: Secondary | ICD-10-CM | POA: Diagnosis not present

## 2016-09-04 NOTE — Progress Notes (Signed)
Office Visit Note   Patient: Austin Hawkins           Date of Birth: 1960/06/06           MRN: [id number] Visit Date: 09/04/2016              Requested by: Horald Pollen, MD [place], [place] [address] PCP: Horald Pollen, MD   Assessment & Plan: Visit Diagnoses:  1. Acute pain of right knee   2. Effusion, right knee     Plan: I was able to aspirate 40-50 mL of yellow fluid from the knee that did not appear consistent with gout. It looks more like an arthritic-type process versus an injury to the knee. He tolerated this very well. He'll continue his diclofenac. I'll see him back in 3 weeks and if he still having issues with the knee, his is a knee that I would definitely obtain an MRI on based on minimal findings on the plain films.  Follow-Up Instructions: Return in about 3 weeks (around 09/25/2016).   Orders:  No orders of the defined types were placed in this encounter.  No orders of the defined types were placed in this encounter.     Procedures: No procedures performed   Clinical Data: No additional findings.   Subjective: No chief complaint on file. The patient comes in with chief complaint of right knee pain. He's been having pain and swelling that knee for about a month now with no known injury. He denies a history of gout. He denies any history of any rheumatologic process. He slowly came on his hurting him on a daily basis. He was seen in urgent care center x-rays are obtained and he was put on her pain medication as well as diclofenac. He denies any locking catching in his knee either. It is detrimentally affected his activities daily living his quality of life. He works daily and a Chief Operating Officer.  HPI  Review of Systems Denies any recent illnesses. Denies any chest pain, short of breath, headache, fever, chills, nausea, vomiting.  Objective: Vital Signs: There were no vitals taken for this visit.  Physical Exam He is alert and  oriented 3 and in no acute distress Ortho Exam Examination of his right knee does show a moderate effusion. Slightly warm but not read. His range of motion is full. His Lachman's and worries are negative they certainly having pain. Specialty Comments:  No specialty comments available.  Imaging: No results found. X-rays on the canopy system and the independently reviewed by me show right knee with a moderate effusion. With no acute bony injury. His joint space is still well maintained.  PMFS History: Patient Active Problem List   Diagnosis Date Noted  . Elevated PSA, between 10 and less than 20 ng/ml 08/20/2016  . Idiopathic hematuria 08/20/2016  . Polyuria 08/20/2016  . Encounter for screening for HIV 08/20/2016  . Screen for STD (sexually transmitted disease) 08/20/2016  . Acute pain of right knee 08/08/2016  . Bursitis of right knee 08/08/2016   Past Medical History:  Diagnosis Date  . Allergy     No family history on file.  No past surgical history on file. Social History   Occupational History  . Not on file.   Social History Main Topics  . Smoking status: Current Every Day Smoker    Packs/day: 1.00    Types: Cigarettes  . Smokeless tobacco: Never Used  . Alcohol use Yes  . Drug use:  No  . Sexual activity: Not on file

## 2016-09-23 ENCOUNTER — Encounter: Payer: Self-pay | Admitting: Student

## 2016-09-25 ENCOUNTER — Ambulatory Visit (INDEPENDENT_AMBULATORY_CARE_PROVIDER_SITE_OTHER): Payer: 59 | Admitting: Orthopaedic Surgery

## 2016-10-04 DIAGNOSIS — R972 Elevated prostate specific antigen [PSA]: Secondary | ICD-10-CM | POA: Diagnosis not present

## 2016-10-23 ENCOUNTER — Other Ambulatory Visit: Payer: Self-pay | Admitting: Urology

## 2016-10-23 DIAGNOSIS — C61 Malignant neoplasm of prostate: Secondary | ICD-10-CM

## 2016-11-06 ENCOUNTER — Encounter (HOSPITAL_COMMUNITY)
Admission: RE | Admit: 2016-11-06 | Discharge: 2016-11-06 | Disposition: A | Discharge status: Home / Self Care | Payer: 59 | Source: Ambulatory Visit | Attending: Urology | Admitting: Urology

## 2016-11-06 DIAGNOSIS — C61 Malignant neoplasm of prostate: Secondary | ICD-10-CM | POA: Diagnosis present

## 2016-11-06 DIAGNOSIS — R972 Elevated prostate specific antigen [PSA]: Secondary | ICD-10-CM | POA: Diagnosis not present

## 2016-11-06 MED ORDER — TECHNETIUM TC 99M MEDRONATE IV KIT
20.3000 | PACK | Freq: Once | INTRAVENOUS | Status: AC | PRN
  Administered 2016-11-06: 20.3 via INTRAVENOUS

## 2016-11-08 DIAGNOSIS — C61 Malignant neoplasm of prostate: Secondary | ICD-10-CM | POA: Diagnosis not present

## 2016-11-11 DIAGNOSIS — C61 Malignant neoplasm of prostate: Secondary | ICD-10-CM | POA: Diagnosis not present

## 2016-11-14 ENCOUNTER — Other Ambulatory Visit: Payer: Self-pay | Admitting: Urology

## 2016-12-20 DIAGNOSIS — R278 Other lack of coordination: Secondary | ICD-10-CM | POA: Diagnosis not present

## 2016-12-20 DIAGNOSIS — C61 Malignant neoplasm of prostate: Secondary | ICD-10-CM | POA: Diagnosis not present

## 2016-12-20 DIAGNOSIS — M6281 Muscle weakness (generalized): Secondary | ICD-10-CM | POA: Diagnosis not present

## 2016-12-30 DIAGNOSIS — C61 Malignant neoplasm of prostate: Secondary | ICD-10-CM | POA: Diagnosis not present

## 2016-12-31 NOTE — Patient Instructions (Signed)
Birch Farino  12/31/2016   Your procedure is scheduled on: 01-08-17   Report to [facility] Main  Entrance Take [place]  elevators to 3rd floor to  [place] at 10:15 AM.    Call this number if you have problems the morning of surgery [phone]    Remember: ONLY 1 PERSON MAY GO WITH YOU TO SHORT STAY TO GET  READY MORNING OF [place].  Do not eat food or drink liquids :After Midnight.     Take these medicines the morning of surgery with A SIP OF WATER: None                                You may not have any metal on your body including hair pins and              piercings  Do not wear jewelry, make-up, lotions, powders or perfumes, deodorant             Men may shave face and neck.   Do not bring valuables to the hospital. [place].  Contacts, dentures or bridgework may not be worn into surgery.  Leave suitcase in the car. After surgery it may be brought to your room.     Please read over the following fact sheets you were given: _____________________________________________________________________           [facility] - Preparing for Surgery Before surgery, you can play an important role.  Because skin is not sterile, your skin needs to be as free of germs as possible.  You can reduce the number of germs on your skin by washing with CHG (chlorahexidine gluconate) soap before surgery.  CHG is an antiseptic cleaner which kills germs and bonds with the skin to continue killing germs even after washing. Please DO NOT use if you have an allergy to CHG or antibacterial soaps.  If your skin becomes reddened/irritated stop using the CHG and inform your nurse when you arrive at Short Stay. Do not shave (including legs and underarms) for at least 48 hours prior to the first CHG shower.  You may shave your face/neck. Please follow these instructions carefully:  1.  Shower with CHG Soap the night before  surgery and the  morning of Surgery.  2.  If you choose to wash your hair, wash your hair first as usual with your  normal  shampoo.  3.  After you shampoo, rinse your hair and body thoroughly to remove the  shampoo.                           4.  Use CHG as you would any other liquid soap.  You can apply chg directly  to the skin and wash                       Gently with a scrungie or clean washcloth.  5.  Apply the CHG Soap to your body ONLY FROM THE NECK DOWN.   Do not use on face/ open  Wound or open sores. Avoid contact with eyes, ears mouth and genitals (private parts).                       Wash face,  Genitals (private parts) with your normal soap.             6.  Wash thoroughly, paying special attention to the area where your surgery  will be performed.  7.  Thoroughly rinse your body with warm water from the neck down.  8.  DO NOT shower/wash with your normal soap after using and rinsing off  the CHG Soap.                9.  Pat yourself dry with a clean towel.            10.  Wear clean pajamas.            11.  Place clean sheets on your bed the night of your first shower and do not  sleep with pets. Day of Surgery : Do not apply any lotions/deodorants the morning of surgery.  Please wear clean clothes to the hospital/surgery center.  FAILURE TO FOLLOW THESE INSTRUCTIONS MAY RESULT IN THE CANCELLATION OF YOUR SURGERY PATIENT SIGNATURE_________________________________  NURSE SIGNATURE__________________________________  ________________________________________________________________________

## 2017-01-02 ENCOUNTER — Encounter (INDEPENDENT_AMBULATORY_CARE_PROVIDER_SITE_OTHER): Payer: Self-pay

## 2017-01-02 ENCOUNTER — Encounter (HOSPITAL_COMMUNITY): Payer: Self-pay

## 2017-01-02 ENCOUNTER — Encounter (HOSPITAL_COMMUNITY)
Admission: RE | Admit: 2017-01-02 | Discharge: 2017-01-02 | Disposition: A | Discharge status: Home / Self Care | Payer: 59 | Source: Ambulatory Visit | Attending: Urology | Admitting: Urology

## 2017-01-02 DIAGNOSIS — C61 Malignant neoplasm of prostate: Secondary | ICD-10-CM | POA: Diagnosis not present

## 2017-01-02 DIAGNOSIS — Z01812 Encounter for preprocedural laboratory examination: Secondary | ICD-10-CM | POA: Insufficient documentation

## 2017-01-02 DIAGNOSIS — M62838 Other muscle spasm: Secondary | ICD-10-CM | POA: Diagnosis not present

## 2017-01-02 DIAGNOSIS — M6281 Muscle weakness (generalized): Secondary | ICD-10-CM | POA: Diagnosis not present

## 2017-01-02 HISTORY — DX: Malignant (primary) neoplasm, unspecified: C80.1

## 2017-01-02 LAB — BASIC METABOLIC PANEL
Anion gap: 7 (ref 5–15)
BUN: 14 mg/dL (ref 6–20)
CALCIUM: 9.2 mg/dL (ref 8.9–10.3)
CHLORIDE: 106 mmol/L (ref 101–111)
CO2: 25 mmol/L (ref 22–32)
CREATININE: 0.91 mg/dL (ref 0.61–1.24)
GFR calc Af Amer: >60 mL/min (ref >60)
GFR calc non Af Amer: >60 mL/min (ref >60)
Glucose, Bld: 97 mg/dL (ref 65–99)
Potassium: 4.5 mmol/L (ref 3.5–5.1)
Sodium: 138 mmol/L (ref 135–145)

## 2017-01-02 LAB — CBC
HCT: 36.4 % — ABNORMAL LOW (ref 39.0–52.0)
Hemoglobin: 12 g/dL — ABNORMAL LOW (ref 13.0–17.0)
MCH: 28.7 pg (ref 26.0–34.0)
MCHC: 33 g/dL (ref 30.0–36.0)
MCV: 87.1 fL (ref 78.0–100.0)
PLATELETS: 337 10*3/uL (ref 150–400)
RBC: 4.18 MIL/uL — ABNORMAL LOW (ref 4.22–5.81)
RDW: 13.6 % (ref 11.5–15.5)
WBC: 4.3 10*3/uL (ref 4.0–10.5)

## 2017-01-02 LAB — ABO/RH: ABO/RH(D): B POS

## 2017-01-03 LAB — URINE CULTURE: CULTURE: NO GROWTH

## 2017-01-08 ENCOUNTER — Observation Stay (HOSPITAL_COMMUNITY)
Admission: RE | Admit: 2017-01-08 | Discharge: 2017-01-09 | Disposition: A | Discharge status: Home / Self Care | Payer: 59 | Source: Ambulatory Visit | Attending: Urology | Admitting: Urology

## 2017-01-08 ENCOUNTER — Ambulatory Visit (HOSPITAL_COMMUNITY): Payer: 59 | Admitting: Certified Registered Nurse Anesthetist

## 2017-01-08 ENCOUNTER — Encounter (HOSPITAL_COMMUNITY): Payer: Self-pay | Admitting: *Deleted

## 2017-01-08 ENCOUNTER — Encounter (HOSPITAL_COMMUNITY)
Admission: RE | Disposition: A | Discharge status: Home / Self Care | Payer: Self-pay | Source: Ambulatory Visit | Attending: Urology

## 2017-01-08 DIAGNOSIS — R972 Elevated prostate specific antigen [PSA]: Secondary | ICD-10-CM | POA: Diagnosis not present

## 2017-01-08 DIAGNOSIS — M7051 Other bursitis of knee, right knee: Secondary | ICD-10-CM | POA: Diagnosis not present

## 2017-01-08 DIAGNOSIS — C61 Malignant neoplasm of prostate: Principal | ICD-10-CM | POA: Diagnosis present

## 2017-01-08 DIAGNOSIS — F17210 Nicotine dependence, cigarettes, uncomplicated: Secondary | ICD-10-CM | POA: Diagnosis not present

## 2017-01-08 HISTORY — PX: ROBOT ASSISTED LAPAROSCOPIC RADICAL PROSTATECTOMY: SHX5141

## 2017-01-08 LAB — HEMOGLOBIN AND HEMATOCRIT, BLOOD
HEMATOCRIT: 34.8 % — AB (ref 39.0–52.0)
HEMOGLOBIN: 11.6 g/dL — AB (ref 13.0–17.0)

## 2017-01-08 LAB — TYPE AND SCREEN
ABO/RH(D): B POS
Antibody Screen: NEGATIVE

## 2017-01-08 SURGERY — PROSTATECTOMY, RADICAL, ROBOT-ASSISTED, LAPAROSCOPIC
Anesthesia: General | Laterality: Bilateral

## 2017-01-08 MED ORDER — FENTANYL CITRATE (PF) 100 MCG/2ML IJ SOLN
INTRAMUSCULAR | Status: DC | PRN
  Administered 2017-01-08 (×2): 50 ug via INTRAVENOUS
  Administered 2017-01-08 (×2): 100 ug via INTRAVENOUS
  Administered 2017-01-08: 50 ug via INTRAVENOUS

## 2017-01-08 MED ORDER — SODIUM CHLORIDE 0.9% FLUSH: 3.0000 mL | INTRAVENOUS | Status: DC | PRN

## 2017-01-08 MED ORDER — LACTATED RINGERS IR SOLN
Status: DC | PRN
  Administered 2017-01-08: 1000 mL

## 2017-01-08 MED ORDER — SODIUM CHLORIDE 0.9% FLUSH: 3.0000 mL | Freq: Two times a day (BID) | INTRAVENOUS | Status: DC

## 2017-01-08 MED ORDER — SULFAMETHOXAZOLE-TRIMETHOPRIM 800-160 MG PO TABS: 1.0000 | ORAL_TABLET | Freq: Two times a day (BID) | ORAL | 0 refills | Status: DC

## 2017-01-08 MED ORDER — ALBUMIN HUMAN 5 % IV SOLN
INTRAVENOUS | Status: DC | PRN
  Administered 2017-01-08: 15:00:00 via INTRAVENOUS

## 2017-01-08 MED ORDER — SODIUM CHLORIDE 0.9 % IJ SOLN
INTRAMUSCULAR | Status: AC
  Filled 2017-01-08: qty 20

## 2017-01-08 MED ORDER — DEXTROSE-NACL 5-0.45 % IV SOLN
INTRAVENOUS | Status: DC
  Administered 2017-01-08: 18:00:00 via INTRAVENOUS

## 2017-01-08 MED ORDER — BUPIVACAINE LIPOSOME 1.3 % IJ SUSP
INTRAMUSCULAR | Status: DC | PRN
  Administered 2017-01-08: 40 mL

## 2017-01-08 MED ORDER — PHENYLEPHRINE 40 MCG/ML (10ML) SYRINGE FOR IV PUSH (FOR BLOOD PRESSURE SUPPORT)
PREFILLED_SYRINGE | INTRAVENOUS | Status: AC
  Filled 2017-01-08: qty 10

## 2017-01-08 MED ORDER — PROMETHAZINE HCL 25 MG/ML IJ SOLN: 6.2500 mg | INTRAMUSCULAR | Status: DC | PRN

## 2017-01-08 MED ORDER — SUGAMMADEX SODIUM 200 MG/2ML IV SOLN
INTRAVENOUS | Status: AC
  Filled 2017-01-08: qty 2

## 2017-01-08 MED ORDER — SENNOSIDES-DOCUSATE SODIUM 8.6-50 MG PO TABS
2.0000 | ORAL_TABLET | Freq: Every day | ORAL | Status: DC
  Administered 2017-01-08: 2 via ORAL
  Filled 2017-01-08: qty 2

## 2017-01-08 MED ORDER — ROCURONIUM BROMIDE 50 MG/5ML IV SOSY
PREFILLED_SYRINGE | INTRAVENOUS | Status: AC
  Filled 2017-01-08: qty 5

## 2017-01-08 MED ORDER — ONDANSETRON HCL 4 MG/2ML IJ SOLN
INTRAMUSCULAR | Status: AC
  Filled 2017-01-08: qty 2

## 2017-01-08 MED ORDER — KETOROLAC TROMETHAMINE 15 MG/ML IJ SOLN
15.0000 mg | Freq: Four times a day (QID) | INTRAMUSCULAR | Status: DC
  Administered 2017-01-08 – 2017-01-09 (×5): 15 mg via INTRAVENOUS
  Filled 2017-01-08 (×5): qty 1

## 2017-01-08 MED ORDER — MIDAZOLAM HCL 5 MG/5ML IJ SOLN
INTRAMUSCULAR | Status: DC | PRN
  Administered 2017-01-08 (×2): 1 mg via INTRAVENOUS

## 2017-01-08 MED ORDER — SODIUM CHLORIDE 0.9 % IV SOLN: 250.0000 mL | INTRAVENOUS | Status: DC | PRN

## 2017-01-08 MED ORDER — FENTANYL CITRATE (PF) 100 MCG/2ML IJ SOLN
INTRAMUSCULAR | Status: AC
Start: 2017-01-08 — End: ?
  Filled 2017-01-08: qty 2

## 2017-01-08 MED ORDER — HYDROMORPHONE HCL-NACL 0.5-0.9 MG/ML-% IV SOSY
PREFILLED_SYRINGE | INTRAVENOUS | Status: AC
  Administered 2017-01-08: 0.5 mg via INTRAVENOUS
  Filled 2017-01-08: qty 2

## 2017-01-08 MED ORDER — ALBUMIN HUMAN 5 % IV SOLN
INTRAVENOUS | Status: AC
  Filled 2017-01-08: qty 250

## 2017-01-08 MED ORDER — HYDROMORPHONE HCL 2 MG/ML IJ SOLN
INTRAMUSCULAR | Status: AC
  Filled 2017-01-08: qty 1

## 2017-01-08 MED ORDER — DEXAMETHASONE SODIUM PHOSPHATE 10 MG/ML IJ SOLN
INTRAMUSCULAR | Status: AC
  Filled 2017-01-08: qty 1

## 2017-01-08 MED ORDER — HYDROMORPHONE HCL 1 MG/ML IJ SOLN
INTRAMUSCULAR | Status: DC | PRN
  Administered 2017-01-08 (×2): 0.5 mg via INTRAVENOUS
  Administered 2017-01-08: 1 mg via INTRAVENOUS

## 2017-01-08 MED ORDER — BUPIVACAINE-EPINEPHRINE 0.5% -1:200000 IJ SOLN
INTRAMUSCULAR | Status: DC | PRN
  Administered 2017-01-08: 17 mL

## 2017-01-08 MED ORDER — MORPHINE SULFATE (PF) 2 MG/ML IV SOLN: 2.0000 mg | INTRAVENOUS | Status: DC | PRN

## 2017-01-08 MED ORDER — TRAMADOL HCL 50 MG PO TABS: 50.0000 mg | ORAL_TABLET | Freq: Four times a day (QID) | ORAL | Status: DC | PRN

## 2017-01-08 MED ORDER — CEFAZOLIN SODIUM-DEXTROSE 2-4 GM/100ML-% IV SOLN
2.0000 g | INTRAVENOUS | Status: AC
  Administered 2017-01-08: 2 g via INTRAVENOUS
  Filled 2017-01-08: qty 100

## 2017-01-08 MED ORDER — HYDRALAZINE HCL 20 MG/ML IJ SOLN
INTRAMUSCULAR | Status: AC
  Filled 2017-01-08: qty 1

## 2017-01-08 MED ORDER — BUPIVACAINE-EPINEPHRINE (PF) 0.5% -1:200000 IJ SOLN
INTRAMUSCULAR | Status: AC
  Filled 2017-01-08: qty 30

## 2017-01-08 MED ORDER — LIDOCAINE HCL (CARDIAC) 20 MG/ML IV SOLN
INTRAVENOUS | Status: DC | PRN
  Administered 2017-01-08: 80 mg via INTRAVENOUS

## 2017-01-08 MED ORDER — BUPIVACAINE LIPOSOME 1.3 % IJ SUSP
20.0000 mL | Freq: Once | INTRAMUSCULAR | Status: DC
  Filled 2017-01-08: qty 20

## 2017-01-08 MED ORDER — ONDANSETRON HCL 4 MG/2ML IJ SOLN
INTRAMUSCULAR | Status: DC | PRN
  Administered 2017-01-08: 4 mg via INTRAVENOUS

## 2017-01-08 MED ORDER — BISACODYL 10 MG RE SUPP: 10.0000 mg | Freq: Every day | RECTAL | Status: DC | PRN

## 2017-01-08 MED ORDER — SUGAMMADEX SODIUM 200 MG/2ML IV SOLN
INTRAVENOUS | Status: DC | PRN
  Administered 2017-01-08: 200 mg via INTRAVENOUS

## 2017-01-08 MED ORDER — ESMOLOL HCL 100 MG/10ML IV SOLN
INTRAVENOUS | Status: DC | PRN
  Administered 2017-01-08 (×2): 20 mg via INTRAVENOUS

## 2017-01-08 MED ORDER — TRAMADOL HCL 50 MG PO TABS: 50.0000 mg | ORAL_TABLET | Freq: Four times a day (QID) | ORAL | 0 refills | Status: DC | PRN

## 2017-01-08 MED ORDER — FENTANYL CITRATE (PF) 250 MCG/5ML IJ SOLN
INTRAMUSCULAR | Status: AC
  Filled 2017-01-08: qty 5

## 2017-01-08 MED ORDER — ESMOLOL HCL 100 MG/10ML IV SOLN
INTRAVENOUS | Status: AC
  Filled 2017-01-08: qty 10

## 2017-01-08 MED ORDER — KETOROLAC TROMETHAMINE 30 MG/ML IJ SOLN
30.0000 mg | Freq: Once | INTRAMUSCULAR | Status: DC | PRN
Start: 2017-01-08 — End: 2017-01-08

## 2017-01-08 MED ORDER — HYDRALAZINE HCL 20 MG/ML IJ SOLN
10.0000 mg | Freq: Once | INTRAMUSCULAR | Status: DC
Start: 2017-01-08 — End: 2017-01-08
  Filled 2017-01-08: qty 0.5

## 2017-01-08 MED ORDER — PHENYLEPHRINE HCL 10 MG/ML IJ SOLN
INTRAMUSCULAR | Status: DC | PRN
  Administered 2017-01-08 (×5): 80 ug via INTRAVENOUS

## 2017-01-08 MED ORDER — HEPARIN SODIUM (PORCINE) 5000 UNIT/ML IJ SOLN
5000.0000 [IU] | Freq: Three times a day (TID) | INTRAMUSCULAR | Status: DC
  Administered 2017-01-08 – 2017-01-09 (×2): 5000 [IU] via SUBCUTANEOUS
  Filled 2017-01-08 (×2): qty 1

## 2017-01-08 MED ORDER — DOCUSATE SODIUM 100 MG PO CAPS
100.0000 mg | ORAL_CAPSULE | Freq: Two times a day (BID) | ORAL | Status: DC
  Administered 2017-01-08: 100 mg via ORAL
  Filled 2017-01-08: qty 1

## 2017-01-08 MED ORDER — PROPOFOL 10 MG/ML IV BOLUS
INTRAVENOUS | Status: DC | PRN
  Administered 2017-01-08: 150 mg via INTRAVENOUS
  Administered 2017-01-08: 50 mg via INTRAVENOUS

## 2017-01-08 MED ORDER — LACTATED RINGERS IV SOLN
INTRAVENOUS | Status: DC
  Administered 2017-01-08 (×3): via INTRAVENOUS

## 2017-01-08 MED ORDER — DEXAMETHASONE SODIUM PHOSPHATE 4 MG/ML IJ SOLN
INTRAMUSCULAR | Status: DC | PRN
  Administered 2017-01-08: 10 mg via INTRAVENOUS

## 2017-01-08 MED ORDER — ACETAMINOPHEN 10 MG/ML IV SOLN
1000.0000 mg | Freq: Four times a day (QID) | INTRAVENOUS | Status: DC
Start: 2017-01-08 — End: 2017-01-09
  Administered 2017-01-08 – 2017-01-09 (×3): 1000 mg via INTRAVENOUS
  Filled 2017-01-08 (×4): qty 100

## 2017-01-08 MED ORDER — SODIUM CHLORIDE 0.9 % IJ SOLN
INTRAMUSCULAR | Status: DC | PRN
  Administered 2017-01-08: 20 mL

## 2017-01-08 MED ORDER — ROCURONIUM BROMIDE 100 MG/10ML IV SOLN
INTRAVENOUS | Status: DC | PRN
  Administered 2017-01-08: 20 mg via INTRAVENOUS
  Administered 2017-01-08 (×2): 10 mg via INTRAVENOUS
  Administered 2017-01-08: 50 mg via INTRAVENOUS

## 2017-01-08 MED ORDER — LIDOCAINE 2% (20 MG/ML) 5 ML SYRINGE
INTRAMUSCULAR | Status: AC
  Filled 2017-01-08: qty 5

## 2017-01-08 MED ORDER — HYDRALAZINE HCL 20 MG/ML IJ SOLN
INTRAMUSCULAR | Status: DC | PRN
  Administered 2017-01-08: 10 mg via INTRAVENOUS

## 2017-01-08 MED ORDER — MIDAZOLAM HCL 2 MG/2ML IJ SOLN
INTRAMUSCULAR | Status: AC
  Filled 2017-01-08: qty 2

## 2017-01-08 MED ORDER — HYDROMORPHONE HCL-NACL 0.5-0.9 MG/ML-% IV SOSY
0.2500 mg | PREFILLED_SYRINGE | INTRAVENOUS | Status: DC | PRN
  Administered 2017-01-08: 0.5 mg via INTRAVENOUS

## 2017-01-08 SURGICAL SUPPLY — 53 items
APPLICATOR COTTON TIP 6IN STRL (MISCELLANEOUS) ×2 IMPLANT
CATH FOLEY 2WAY SLVR 18FR 30CC (CATHETERS) ×2 IMPLANT
CATH TIEMANN FOLEY 18FR 5CC (CATHETERS) ×2 IMPLANT
CHLORAPREP W/TINT 26ML (MISCELLANEOUS) ×2 IMPLANT
CLIP VESOLOCK LG 6/CT PURPLE (CLIP) ×6 IMPLANT
COVER SURGICAL LIGHT HANDLE (MISCELLANEOUS) ×2 IMPLANT
COVER TIP SHEARS 8 DVNC (MISCELLANEOUS) ×1 IMPLANT
COVER TIP SHEARS 8MM DA VINCI (MISCELLANEOUS) ×1
CUTTER ECHEON FLEX ENDO 45 340 (ENDOMECHANICALS) ×2 IMPLANT
DECANTER SPIKE VIAL GLASS SM (MISCELLANEOUS) ×2 IMPLANT
DERMABOND ADVANCED (GAUZE/BANDAGES/DRESSINGS) ×1
DERMABOND ADVANCED .7 DNX12 (GAUZE/BANDAGES/DRESSINGS) ×1 IMPLANT
DRAPE ARM DVNC X/XI (DISPOSABLE) ×4 IMPLANT
DRAPE COLUMN DVNC XI (DISPOSABLE) ×1 IMPLANT
DRAPE DA VINCI XI ARM (DISPOSABLE) ×4
DRAPE DA VINCI XI COLUMN (DISPOSABLE) ×1
DRAPE SURG IRRIG POUCH 19X23 (DRAPES) ×2 IMPLANT
DRSG TEGADERM 4X4.75 (GAUZE/BANDAGES/DRESSINGS) ×2 IMPLANT
ELECT REM PT RETURN 15FT ADLT (MISCELLANEOUS) ×2 IMPLANT
GAUZE SPONGE 2X2 8PLY STRL LF (GAUZE/BANDAGES/DRESSINGS) IMPLANT
GLOVE BIO SURGEON STRL SZ 6.5 (GLOVE) ×2 IMPLANT
GLOVE BIOGEL M STRL SZ7.5 (GLOVE) ×4 IMPLANT
GOWN STRL REUS W/TWL LRG LVL3 (GOWN DISPOSABLE) ×2 IMPLANT
GOWN STRL REUS W/TWL XL LVL3 (GOWN DISPOSABLE) ×4 IMPLANT
HEMOSTAT SURGICEL 4X8 (HEMOSTASIS) ×2 IMPLANT
HOLDER FOLEY CATH W/STRAP (MISCELLANEOUS) ×2 IMPLANT
IRRIG SUCT STRYKERFLOW 2 WTIP (MISCELLANEOUS) ×2
IRRIGATION SUCT STRKRFLW 2 WTP (MISCELLANEOUS) ×1 IMPLANT
IV LACTATED RINGERS 1000ML (IV SOLUTION) IMPLANT
PACK ROBOT UROLOGY CUSTOM (CUSTOM PROCEDURE TRAY) ×2 IMPLANT
PAD POSITIONING PINK XL (MISCELLANEOUS) ×2 IMPLANT
PORT ACCESS TROCAR AIRSEAL 12 (TROCAR) ×1 IMPLANT
PORT ACCESS TROCAR AIRSEAL 5M (TROCAR) ×1
SEAL CANN UNIV 5-8 DVNC XI (MISCELLANEOUS) ×4 IMPLANT
SEAL XI 5MM-8MM UNIVERSAL (MISCELLANEOUS) ×4
SET TRI-LUMEN FLTR TB AIRSEAL (TUBING) ×2 IMPLANT
SOLUTION ELECTROLUBE (MISCELLANEOUS) ×2 IMPLANT
SPONGE GAUZE 2X2 STER 10/PKG (GAUZE/BANDAGES/DRESSINGS)
STAPLE RELOAD 45 GRN (STAPLE) ×1 IMPLANT
STAPLE RELOAD 45MM GREEN (STAPLE) ×1
SUT ETHILON 3 0 PS 1 (SUTURE) ×2 IMPLANT
SUT MNCRL AB 4-0 PS2 18 (SUTURE) ×4 IMPLANT
SUT V-LOC BARB 180 2/0GR6 GS22 (SUTURE) ×2
SUT VIC AB 0 CT1 27 (SUTURE) ×1
SUT VIC AB 0 CT1 27XBRD ANTBC (SUTURE) ×1 IMPLANT
SUT VIC AB 2-0 SH 27 (SUTURE) ×1
SUT VIC AB 2-0 SH 27X BRD (SUTURE) ×1 IMPLANT
SUT VICRYL 0 UR6 27IN ABS (SUTURE) ×4 IMPLANT
SUT VLOC BARB 180 ABS3/0GR12 (SUTURE) ×4
SUTURE V-LC BRB 180 2/0GR6GS22 (SUTURE) ×1 IMPLANT
SUTURE VLOC BRB 180 ABS3/0GR12 (SUTURE) ×2 IMPLANT
TOWEL OR NON WOVEN STRL DISP B (DISPOSABLE) ×2 IMPLANT
WATER STERILE IRR 1000ML POUR (IV SOLUTION) IMPLANT

## 2017-01-08 NOTE — H&P (Signed)
f/u to monitor Prostate Cancer  HPI: Austin Hawkins is a 56 year-old male established patient who is here for interval evaluation of his prostate cancer.  The patient was last seen May 2018.   He was diagnosed with prostate cancer in 10/04/2016. The patient's gleason score was: 4+3=7. Pretreatment PSA: 19.2.   Last bone scan: June/2018: Uptake in the sixth rib on the right-hand side suspicious for metastatic disease.. Abdomen/Pelvic CT: 6/18: No evidence of pelvic lymphadenopathy or local extension.   The patient denies having urinary incontinence. He has had new lower extremity edema. The patient denies any progressive voiding symptoms.   TRUS: 15.13gm  Path: 4+3= 7 in right base, 3+4= 7 in right lateral aspect x 2, 3+3=6 left lateral  DRE: Abnormal ridge/firm Right lateral lobe   SHIM 19  IPSS 2   The patient had a chest CT scan to follow up bone scan which showed an area in the rib that was suspicious, this was of no clinical significance on the CT scan.   Interval: The patient presents today for follow-up of his new diagnosis of prostate cancer. The patient tolerated his resting biopsy well without any significant issues. He is no longer have any hematuria or blood per rectum. He denied any fever/chills.   The patient is otherwise healthy and takes no medications. He has never had any surgeries before. He does smoke several cigarettes a day. Is done this for the last 40 years.   12/30/16: Pre-op visit scheduled 01/08/17 for Rob AST LAP RAD Prostatectomy w/BIL PLND.     ALLERGIES: None   MEDICATIONS: None   GU PSH: Locm 300-399Mg /Ml Iodine,1Ml - 11/08/2016 Prostate Needle Biopsy - 10/04/2016    NON-GU PSH: Surgical Pathology, Gross And Microscopic Examination For Prostate Needle - 10/04/2016    GU PMH: Prostate Cancer - 11/11/2016 Elevated PSA, Otherwise healthy 56 year old African-American with a elevated PSA and no history of infection or signs or symptoms of progressive urinary  tract symptoms. This is certainly a concern, and prostate cancer should be ruled out. - 08/23/2016    NON-GU PMH: Muscle weakness (generalized) - 12/20/2016 Other lack of coordination - 12/20/2016 Encounter for general adult medical examination without abnormal findings, Encounter for preventive health examination    FAMILY HISTORY: 1 Daughter - Other 1 son - Other Death of family member - Mother, Father stroke - Father   SOCIAL HISTORY: Marital Status: Single Preferred Language: English; Ethnicity: Not Hispanic Or Latino; Race: Black or African American Current Smoking Status: Patient smokes. Has smoked since 08/11/1976. Smokes 1 pack per day.   Tobacco Use Assessment Completed: Used Tobacco in last 30 days? Does not use smokeless tobacco. Drinks 2 drinks per day.  Does not drink caffeine.    REVIEW OF SYSTEMS:    GU Review Male:   Patient denies frequent urination, hard to postpone urination, burning/ pain with urination, get up at night to urinate, leakage of urine, stream starts and stops, trouble starting your stream, have to strain to urinate , erection problems, and penile pain.  Gastrointestinal (Upper):   Patient denies nausea, vomiting, and indigestion/ heartburn.  Gastrointestinal (Lower):   Patient denies diarrhea and constipation.  Constitutional:   Patient reports weight loss. Patient denies fever, night sweats, and fatigue.  Skin:   Patient denies skin rash/ lesion and itching.  Eyes:   Patient denies blurred vision and double vision.  Ears/ Nose/ Throat:   Patient denies sore throat and sinus problems.  Hematologic/Lymphatic:   Patient denies swollen  glands and easy bruising.  Cardiovascular:   Patient reports leg swelling. Patient denies chest pains.  Respiratory:   Patient denies cough and shortness of breath.  Endocrine:   Patient denies excessive thirst.  Musculoskeletal:   Patient denies back pain and joint pain.  Neurological:   Patient denies headaches and  dizziness.  Psychologic:   Patient denies depression and anxiety.   VITAL SIGNS:      12/30/2016 12:04 PM  Weight 120 lb / 54.43 kg  BP 145/92 mmHg  Pulse 60 /min  Temperature 98.3 F / 36.8 C   MULTI-SYSTEM PHYSICAL EXAMINATION:    Constitutional: Well-nourished. No physical deformities. Normally developed. Good grooming.  Neck: Neck symmetrical, not swollen. Normal tracheal position.  Respiratory: No labored breathing, no use of accessory muscles. Clear to auscultation bilaterally  Cardiovascular: Normal temperature, normal extremity pulses, no swelling, no varicosities. Regular rate and rhythm  Lymphatic: No enlargement of neck, axillae, groin.  Skin: No paleness, no jaundice, no cyanosis. No lesion, no ulcer, no rash.  Neurologic / Psychiatric: Oriented to time, oriented to place, oriented to person. No depression, no anxiety, no agitation.  Gastrointestinal: No mass, no tenderness, no rigidity, non obese abdomen.  Eyes: Normal conjunctivae. Normal eyelids.  Ears, Nose, Mouth, and Throat: Left ear no scars, no lesions, no masses. Right ear no scars, no lesions, no masses. Nose no scars, no lesions, no masses. Normal hearing. Normal lips.  Musculoskeletal: Normal gait and station of head and neck.     PAST DATA REVIEWED:  Source Of History:  Patient  Records Review:   Pathology Reports, Previous Patient Records   PROCEDURES:          Urinalysis Dipstick Dipstick Cont'd  Color: Yellow Bilirubin: Neg  Appearance: Clear Ketones: Neg  Specific Gravity: 1.025 Blood: Neg  pH: 5.5 Protein: Neg  Glucose: Neg Urobilinogen: 0.2    Nitrites: Neg    Leukocyte Esterase: Neg    ASSESSMENT:      ICD-10 Details  1 GU:   Prostate Cancer - C61    PLAN:           Schedule Return Visit/Planned Activity: ASAP - Schedule Surgery          Document Letter(s):  Created for Patient: Clinical Summary         Notes:   Plan to perform left sided nerve sparing radical prostatectomy with  bilateral extended pelvic node dissection.   We discussed prostatectomy and specifically robotic prostatectomy with bilateral pelvic lymphadenectomy being the technique that I most commonly perform. I showed the patient on their abdomen the approximately 6 small incision (trocar) sites as well as presumed extraction sites with robotic approach as well as possible open incision sites should open conversion be necessary. We discussed peri-operative risks including bleeding, infection, deep vein thrombosis, pulmonary embolism, compartment syndrome, nuropathy / neuropraxia, heart attack, stroke, death, as well as long-term risks such as non-cure / need for additional therapy. We specifically addressed that the procedure would compromise urinary control leading to stress incontinence which typically resolves with time and pelvic rehabilitation (Kegel's, etc..), but can sometimes be permanent and require additional therapy including surgery. We also specifically addressed sexual sequellae including significant erectile dysfunction which typically partially resolves with time but can also be permanent and require additional therapy including surgery.   We discussed the typical hospital course including usual 1-2 night hospitalization, discharge with foley catheter in place usually for 1-2 weeks before voiding trial as well as usually 2  week recovery until able to perform most non-strenuous activity and 6 weeks until able to return to most jobs and more strenuous activity such as exercise.

## 2017-01-08 NOTE — Discharge Instructions (Signed)

## 2017-01-08 NOTE — Transfer of Care (Signed)
Immediate Anesthesia Transfer of Care Note  Patient: Austin Hawkins  Procedure(s) Performed: Procedure(s): XI ROBOTIC ASSISTED LAPAROSCOPIC RADICAL PROSTATECTOMY WITH BILATERAL PELVIC  NODE  DISSECTON (Bilateral)  Patient Location: PACU  Anesthesia Type:General  Level of Consciousness:  sedated, patient cooperative and responds to stimulation  Airway & Oxygen Therapy:Patient Spontanous Breathing and Patient connected to face mask oxgen  Post-op Assessment:  Report given to PACU RN and Post -op Vital signs reviewed and stable  Post vital signs:  Reviewed and stable  Last Vitals:  Vitals:   01/08/17 0956  BP: (!) 155/89  Pulse: 89  Resp: 18  Temp: 37.1 C  SpO2: 606%    Complications: No apparent anesthesia complications

## 2017-01-08 NOTE — Op Note (Signed)
Preoperative diagnosis:  1. Prostate Cancer   Postoperative diagnosis:  1. same   Procedure: 1. Robotic assisted laparoscopic radical prostatectomy 2. Bilateral pelvic lymph node dissection  Surgeon: Ardis Hughs, MD First Assistant: Debbrah Alar, PA Resident Surgeon: Dr. Jeralyn Ruths, MD  Anesthesia: General  Complications: None  Intraoperative findings: left nerve spare  EBL: 150cc  Specimens:  #1.  Prostate and seminal vesicals #2.  Bilateral pelvic lymph nodes  Indication: Austin Hawkins is a 56 y.o. patient with prostate cancer.  After reviewing the management options for treatment, he elected to proceed with the removal of his prostate. We have discussed the potential benefits and risks of the procedure, side effects of the proposed treatment, the likelihood of the patient achieving the goals of the procedure, and any potential problems that might occur during the procedure or recuperation. Informed consent has been obtained.  Description of procedure: An assistant was required for this surgical procedure.  The duties of the assistant included but were not limited to suctioning, passing suture, camera manipulation, retraction. This procedure would not be able to be performed without an Environmental consultant.  The patient was consented in the preoperative holding area. He was in brought back to the operating room placed the table in supine position. General anesthesia was then induced and endotracheal tube was inserted. He was then placed in dorsolithotomy position and placed in steep Trendelenburg. He was then prepped and draped in the routine sterile fashion. We, the first assistant and I, then began by making a 10 mm incision supraumbilical midline incision the skin down through into the peritoneum. Then placed a 8 mm trocar. I then inflated the abdomen and inserted the 0 robotic lens. We then placed 2 additional a 8 millimeter trochars in the patient's left lower abdomen  proximally 9 cm apart and 2 trochars on the patient's right lower abdomen, one was an 8 mm trocar and the one most lateral was a 12 mm trocar which was used as the assistant port. A 5 mm trocar was placed by triangulating the 2 right lateral ports as a second assistant port. These ports were all placed under visual guidance. Once the ports were noted to be satisfactory position the robot was docked. We started with the 0 lens, monopolar scissors in the right hand and the [place] forceps the left hand as well as a fenestrated grasper as the third arm on the left-hand side.   We, the first assistant and I,  began our dissection the posterior plane incising the peritoneum at the level of the vas deferens. Isolated the left vas deferens and dissected it proximally towards the spermatic cord for 5 cm prior to ligating it. Then used this as traction to isolate the left the seminal vesicle which was then undressed bluntly and completely dissected out, all vessels were cauterized with a combination of bipolar and the monopolar scissors. We then turned our attention to the right side and similarly dissected out the right vas deferens and seminal vesicle. Once the SVs had been freed, we turned our attention to the posterior plane and bluntly dissected the tissue between the rectum and the posterior wall of the prostate bluntly out towards the apex.    At this point the bladder was taken down starting at the urachal remnant with a combination of both blunt dissection and sharp dissection using monopolar cautery the bladder was dropped down in the usual fashion to the medial umbilical ligaments laterally and the dorsal vein of the prostate anteriorly  creating our space of Retzius. We then turned our attention to the endopelvic fascia which was incised laterally starting on the patient's right-hand side the levator muscles were pushed off the prostate laterally up towards the dorsal vein complex on the right-hand side.  This process was then repeated on the left-hand side and a nice notch was created for the dorsal vein. I then used a 29mm stapler to staple the dorsal vein.   We,the first assistant and I, then located the bladder neck at the vesicoprostatic junction and using the monopolar scissors dissected down through the perivesical tissues and the bladder neck down to the prostatic urethra. The catheter was then deflated and pulled through our urethral opening and then used to retract the prostate anteriorly for the posterior bladder neck dissection. Once through the bladder neck and into the posterior plane of the prostate, the SVs were brought through the opening. The left pedicle was then isolated and systematically ligated with Weck clips and scissors. The nerve bundle was then peeled off the posterior lateral aspect of the prostate and bluntly dissected away off the prostate. The right pedicle was taken in a similar fashion, yet the dissection was taken wide and the nerve was not spared.  I then came down through the dorsal venous complex anteriorly down to the membranous urethra using the monopolar. Once down to the urethra, it was transected sharply and the apex of the prostate was then dissected off the levator and rectourethralis muscles. Once the apex of the prostate had been dissected free we came back to the base of the prostate and bluntly push the rectum and nerve vascular bundle off the prostate the patient's left and used clips on the patient's right to free the prostate. Once the prostate was free it was placed off to the side. The pelvis was then irrigated with normal saline and noted to be relatively hemostatic.  Attention was then turned to the right pelvic sidewall. The fibrofatty tissue between the external iliac vein, confluence of the iliac vessels, hypogastric artery, and Cooper's ligament was dissected free from the pelvic sidewall with care to preserve the obturator nerve. Weck clips were used  for lymphostasis and hemostasis. An identical procedure was performed on the contralateral side and the lymphatic packets were removed for permanent pathologic analysis.  The prostate and both lymph node tissues were placed in the Endo Catch bag and the string brought to the 5 mm port.    The vesicourethral anastomosis was then completed with 2 interlocking 3-0 V. lock sutures running the anastomosis in the 6:00 position to the 12:00 position on each side and then tying it off on the top. The final catheter was then passed through the patient's urethra and into the bladder and 120 cc was instilled into the bladder to test the anastomosis. As there was no leak a 44 [place] Blake drain was passed through the left lateral port and placed around the vesicourethral anastomosis. A 12 mm assistant port on the right lateral side was then closed with 0 Vicryl with the help of the [employer] needle. The 12 mm midline infraumbilical incision was then extended another centimeter taken down and the fascia opened to remove the Endo Catch bag with the prostate specimen. The fascia was then closed with a 0 Vicryl and all skin ports were closed with 4-0 Monocryl in a subcutaneous fashion. Dermabond glue was then applied to the incisions. The drain was then secured to the skin with a 0 nylon stitch  and dressing applied.   At the end of the case all laps needles and sponges had been accounted for. There no immediate complications. The patient returned to the PACU in stable condition.

## 2017-01-08 NOTE — Anesthesia Preprocedure Evaluation (Signed)
Anesthesia Evaluation  Patient identified by MRN, date of birth, ID band Patient awake    Reviewed: Allergy & Precautions, NPO status , Patient's Chart, lab work & pertinent test results  Airway Mallampati: II  TM Distance: >3 FB Neck ROM: Full    Dental no notable dental hx.    Pulmonary Current Smoker,    Pulmonary exam normal breath sounds clear to auscultation       Cardiovascular negative cardio ROS Normal cardiovascular exam Rhythm:Regular Rate:Normal     Neuro/Psych negative neurological ROS  negative psych ROS   GI/Hepatic negative GI ROS, Neg liver ROS,   Endo/Other  negative endocrine ROS  Renal/GU negative Renal ROS  negative genitourinary   Musculoskeletal negative musculoskeletal ROS (+)   Abdominal   Peds negative pediatric ROS (+)  Hematology negative hematology ROS (+)   Anesthesia Other Findings   Reproductive/Obstetrics negative OB ROS                             Anesthesia Physical Anesthesia Plan  ASA: II  Anesthesia Plan: General   Post-op Pain Management:    Induction: Intravenous  PONV Risk Score and Plan: 2 and Ondansetron and Dexamethasone  Airway Management Planned: Oral ETT  Additional Equipment:   Intra-op Plan:   Post-operative Plan: Extubation in OR  Informed Consent: I have reviewed the patients History and Physical, chart, labs and discussed the procedure including the risks, benefits and alternatives for the proposed anesthesia with the patient or authorized representative who has indicated his/her understanding and acceptance.   Dental advisory given  Plan Discussed with: CRNA and Surgeon  Anesthesia Plan Comments:         Anesthesia Quick Evaluation

## 2017-01-08 NOTE — Interval H&P Note (Signed)
History and Physical Interval Note:  01/08/2017 11:55 AM  Austin Hawkins  has presented today for surgery, with the diagnosis of prostate cancer  The various methods of treatment have been discussed with the patient and family. After consideration of risks, benefits and other options for treatment, the patient has consented to  Procedure(s): XI ROBOTIC [place] (Bilateral) as a surgical intervention .  The patient's history has been reviewed, patient examined, no change in status, stable for surgery.  I have reviewed the patient's chart and labs.  Questions were answered to the patient's satisfaction.     Louis Meckel W

## 2017-01-08 NOTE — Anesthesia Procedure Notes (Signed)
Procedure Name: Intubation Date/Time: 01/08/2017 12:30 PM Performed by: Claudia Desanctis Pre-anesthesia Checklist: Patient identified, Emergency Drugs available, Suction available and Patient being monitored Patient Re-evaluated:Patient Re-evaluated prior to induction Oxygen Delivery Method: Circle system utilized Preoxygenation: Pre-oxygenation with 100% oxygen Induction Type: IV induction Ventilation: Mask ventilation without difficulty Laryngoscope Size: 2 and Miller Grade View: Grade I Tube type: Oral Laser Tube: Cuffed inflated with minimal occlusive pressure - saline Tube size: 7.5 mm Number of attempts: 1 Airway Equipment and Method: Stylet Placement Confirmation: ETT inserted through vocal cords under direct vision,  positive ETCO2 and breath sounds checked- equal and bilateral Secured at: 22 cm Tube secured with: Tape Dental Injury: Teeth and Oropharynx as per pre-operative assessment

## 2017-01-09 DIAGNOSIS — C61 Malignant neoplasm of prostate: Secondary | ICD-10-CM | POA: Diagnosis not present

## 2017-01-09 LAB — BASIC METABOLIC PANEL
ANION GAP: 7 (ref 5–15)
BUN: 9 mg/dL (ref 6–20)
CALCIUM: 8.2 mg/dL — AB (ref 8.9–10.3)
CO2: 21 mmol/L — ABNORMAL LOW (ref 22–32)
Chloride: 105 mmol/L (ref 101–111)
Creatinine, Ser: 0.97 mg/dL (ref 0.61–1.24)
GLUCOSE: 148 mg/dL — AB (ref 65–99)
POTASSIUM: 4 mmol/L (ref 3.5–5.1)
SODIUM: 133 mmol/L — AB (ref 135–145)

## 2017-01-09 LAB — CBC
HCT: 31.5 % — ABNORMAL LOW (ref 39.0–52.0)
Hemoglobin: 10.7 g/dL — ABNORMAL LOW (ref 13.0–17.0)
MCH: 29.5 pg (ref 26.0–34.0)
MCHC: 34 g/dL (ref 30.0–36.0)
MCV: 86.8 fL (ref 78.0–100.0)
PLATELETS: 269 10*3/uL (ref 150–400)
RBC: 3.63 MIL/uL — AB (ref 4.22–5.81)
RDW: 13.6 % (ref 11.5–15.5)
WBC: 9.1 10*3/uL (ref 4.0–10.5)

## 2017-01-09 LAB — CREATININE, FLUID (PLEURAL, PERITONEAL, JP DRAINAGE): CREAT FL: 1.3 mg/dL

## 2017-01-09 NOTE — Progress Notes (Signed)
1 Day Post-Op Subjective: The patient is doing well.  No nausea or vomiting. Pain is adequately controlled.  Objective: Vital signs in last 24 hours: Temp:  [97.4 F (36.3 C)-99.2 F (37.3 C)] 98.8 F (37.1 C) (08/30 1331) Pulse Rate:  [66-113] 78 (08/30 1331) Resp:  [10-32] 16 (08/30 0518) BP: (125-170)/(66-100) 137/84 (08/30 1331) SpO2:  [96 %-100 %] 100 % (08/30 1331)  Intake/Output from previous day: 08/29 0701 - 08/30 0700 In: 4155.8 [P.O.:360; I.V.:3245.8; IV Piggyback:550] Out: 1520 [VANVB:1660; Drains:195; Blood:150] Intake/Output this shift: Total I/O In: 120 [P.O.:120] Out: 620 [Urine:500; Drains:120]  Physical Exam:  General: Alert and oriented. GI: Soft, Nondistended. Incisions: Dressings intact. Urine: Clear Extremities: Nontender, no erythema, no edema.  Lab Results:  Recent Labs  01/08/17 1816 01/09/17 0340  WBC  --  9.1  HGB 11.6* 10.7*  HCT 34.8* 31.5*    Recent Labs  01/09/17 0340  NA 133*  K 4.0  CL 105  CO2 21*  GLUCOSE 148*  BUN 9  CREATININE 0.97  CALCIUM 8.2*      Assessment/Plan: POD# 1 s/p robotic prostatectomy.  1) SL IVF 2) Ambulate, Incentive spirometry 3) Transition to oral pain medication 4) Dulcolax suppository 5) D/C pelvic drain 6) Plan for likely discharge later today   LOS: 0 days   Louis Meckel W 01/09/2017, 1:46 PM

## 2017-01-09 NOTE — Anesthesia Postprocedure Evaluation (Signed)
Anesthesia Post Note  Patient: Austin Hawkins  Procedure(s) Performed: Procedure(s) (LRB): XI ROBOTIC ASSISTED LAPAROSCOPIC RADICAL PROSTATECTOMY WITH BILATERAL PELVIC  NODE  DISSECTON (Bilateral)     Patient location during evaluation: PACU Anesthesia Type: General Level of consciousness: awake and alert Pain management: pain level controlled Vital Signs Assessment: post-procedure vital signs reviewed and stable Respiratory status: spontaneous breathing, nonlabored ventilation, respiratory function stable and patient connected to nasal cannula oxygen Cardiovascular status: blood pressure returned to baseline and stable Postop Assessment: no signs of nausea or vomiting Anesthetic complications: no    Last Vitals:  Vitals:   01/09/17 0203 01/09/17 0518  BP: 130/81 128/78  Pulse: 66 71  Resp: 16 16  Temp: 37.3 C 37.2 C  SpO2: 100% 100%    Last Pain:  Vitals:   01/09/17 0647  TempSrc:   PainSc: 5                  Lakea Mittelman P Monicia Tse

## 2017-01-09 NOTE — Discharge Summary (Signed)
Date of admission: 01/08/2017  Date of discharge: 01/09/2017  Admission diagnosis: prostate cancer  Discharge diagnosis: same  Secondary diagnoses:  Patient Active Problem List   Diagnosis Date Noted  . Prostate cancer ([place]) 01/08/2017  . Elevated PSA, between 10 and less than 20 ng/ml 08/20/2016  . Idiopathic hematuria 08/20/2016  . Polyuria 08/20/2016  . Encounter for screening for HIV 08/20/2016  . Screen for STD (sexually transmitted disease) 08/20/2016  . Acute pain of right knee 08/08/2016  . Bursitis of right knee 08/08/2016    Procedures performed: Procedure(s): XI ROBOTIC ASSISTED LAPAROSCOPIC RADICAL PROSTATECTOMY WITH BILATERAL PELVIC  NODE  DISSECTON  History and Physical: For full details, please see admission history and physical. Briefly, Austin Hawkins is a 56 y.o. year old patient with intermediate risk prostate cancer.   Hospital Course: Patient tolerated the procedure well.  He was then transferred to the floor after an uneventful PACU stay.  His hospital course was uncomplicated.  On POD#1 he had met discharge criteria: was eating a liquid diet, was up and ambulating independently,  pain was well controlled, and was ready to for discharge.   Laboratory values:   Recent Labs  01/08/17 1816 01/09/17 0340  WBC  --  9.1  HGB 11.6* 10.7*  HCT 34.8* 31.5*    Recent Labs  01/09/17 0340  NA 133*  K 4.0  CL 105  CO2 21*  GLUCOSE 148*  BUN 9  CREATININE 0.97  CALCIUM 8.2*   No results for input(s): LABPT, INR in the last 72 hours. No results for input(s): LABURIN in the last 72 hours. Results for orders placed or performed during the hospital encounter of 01/02/17  Urine culture     Status: None   Collection Time: 01/02/17  8:07 AM  Result Value Ref Range Status   Specimen Description URINE, RANDOM  Final   Special Requests NONE  Final   Culture   Final    NO GROWTH Performed at [facility], [place] [address] [place], [place] [address]    Report Status 01/03/2017 FINAL  Final    Disposition: Home  Discharge instruction: The patient was instructed to be ambulatory but told to refrain from heavy lifting, strenuous activity, or driving.   Discharge medications: Allergies as of 01/09/2017      Reactions   Wool Alcohol [lanolin]       Medication List    TAKE these medications   predniSONE 20 MG tablet Commonly known as:  DELTASONE Take 20 mg by mouth daily with breakfast.   sulfamethoxazole-trimethoprim 800-160 MG tablet Commonly known as:  BACTRIM DS,SEPTRA DS Take 1 tablet by mouth 2 (two) times daily. Start the day prior to foley removal appointment   traMADol 50 MG tablet Commonly known as:  ULTRAM Take 1-2 tablets (50-100 mg total) by mouth every 6 (six) hours as needed for moderate pain or severe pain.            Discharge Care Instructions        Start     Ordered   01/08/17 0000  sulfamethoxazole-trimethoprim (BACTRIM DS,SEPTRA DS) 800-160 MG tablet  2 times daily    Question:  Supervising Provider  Answer:  Tresa Moore, Hubbard Robinson   01/08/17 1046   01/08/17 0000  traMADol (ULTRAM) 50 MG tablet  Every 6 hours PRN    Question:  Supervising Provider  Answer:  Alexis Frock   01/08/17 1046      Followup:  Follow-up Information  Ardis Hughs, MD Follow up on 01/16/2017.   Specialty:  Urology Why:  at 2:00 Contact information: [place] [address] [phone]

## 2017-01-09 NOTE — Progress Notes (Signed)
Discharged completed, reviewed incision site care, leg bag/drainage bag teaching with patient. Pt and daughter able to demonstrate proper care. Pt given  instruction booklet. Will cont to monitor and notify MD. SRP, RN

## 2017-01-09 NOTE — Progress Notes (Signed)
Pt given Toradol 15 mg priro to discharge per MD order. SRP,RN

## 2017-01-09 NOTE — Progress Notes (Signed)
Pt has been up several times ambulating in the hall, tol clear liquids and denies pain and discomfort. JP patent with decreased drainage. Foley draining. Pt currently sitting in chair, will cont to monitor. SRP, RN

## 2017-01-31 DIAGNOSIS — M62838 Other muscle spasm: Secondary | ICD-10-CM | POA: Diagnosis not present

## 2017-01-31 DIAGNOSIS — M6281 Muscle weakness (generalized): Secondary | ICD-10-CM | POA: Diagnosis not present

## 2017-02-27 DIAGNOSIS — M6281 Muscle weakness (generalized): Secondary | ICD-10-CM | POA: Diagnosis not present

## 2017-02-27 DIAGNOSIS — M62838 Other muscle spasm: Secondary | ICD-10-CM | POA: Diagnosis not present

## 2017-05-29 DIAGNOSIS — Z8546 Personal history of malignant neoplasm of prostate: Secondary | ICD-10-CM | POA: Diagnosis not present

## 2017-10-10 DIAGNOSIS — Z8546 Personal history of malignant neoplasm of prostate: Secondary | ICD-10-CM | POA: Diagnosis not present

## 2017-10-10 DIAGNOSIS — R9721 Rising PSA following treatment for malignant neoplasm of prostate: Secondary | ICD-10-CM | POA: Diagnosis not present

## 2018-03-27 DIAGNOSIS — R9721 Rising PSA following treatment for malignant neoplasm of prostate: Secondary | ICD-10-CM | POA: Diagnosis not present

## 2018-03-27 DIAGNOSIS — Z8546 Personal history of malignant neoplasm of prostate: Secondary | ICD-10-CM | POA: Diagnosis not present

## 2019-06-07 ENCOUNTER — Ambulatory Visit (HOSPITAL_COMMUNITY)
Admission: EM | Admit: 2019-06-07 | Discharge: 2019-06-07 | Disposition: A | Discharge status: Home / Self Care | Payer: 59 | Attending: Family Medicine | Admitting: Family Medicine

## 2019-06-07 ENCOUNTER — Telehealth (HOSPITAL_COMMUNITY): Payer: Self-pay

## 2019-06-07 ENCOUNTER — Encounter (HOSPITAL_COMMUNITY): Payer: Self-pay | Admitting: Emergency Medicine

## 2019-06-07 ENCOUNTER — Other Ambulatory Visit: Payer: Self-pay

## 2019-06-07 DIAGNOSIS — R519 Headache, unspecified: Secondary | ICD-10-CM | POA: Diagnosis present

## 2019-06-07 DIAGNOSIS — Z20822 Contact with and (suspected) exposure to covid-19: Secondary | ICD-10-CM | POA: Insufficient documentation

## 2019-06-07 MED ORDER — BUTALBITAL-APAP-CAFFEINE 50-325-40 MG PO TABS: 1.0000 | ORAL_TABLET | Freq: Four times a day (QID) | ORAL | 0 refills | Status: DC | PRN

## 2019-06-07 MED ORDER — BUTALBITAL-APAP-CAFFEINE 50-325-40 MG PO TABS: 1.0000 | ORAL_TABLET | Freq: Four times a day (QID) | ORAL | 0 refills | Status: AC | PRN

## 2019-06-07 NOTE — ED Notes (Signed)
Patient in gown, nasal swab in lab

## 2019-06-07 NOTE — ED Triage Notes (Signed)
06/01/2019 sneezing and dizziness.  Patient did take a covid test at [facility] and reported as negative.  Patient has headache, intermittent.  Head particularly hurts at night when lying down.  One night of nausea, vomiting.    Symptoms today limited to headache.

## 2019-06-07 NOTE — Discharge Instructions (Signed)
Take the headache medicine as needed and lie down Drink plenty of fluids and rest You must quarantine until the test result is available Check My Chart for results

## 2019-06-07 NOTE — ED Provider Notes (Signed)
[facility]    CSN: [id number] Arrival date & time: 06/07/19  [id number]      History   Chief Complaint Chief Complaint  Patient presents with  . Dizziness    HPI Austin Hawkins is a 59 y.o. male.   HPI  Patient states that on 06/01/2019 he had sneezing, dizziness, had a Covid test that was work is negative.  He had a headache with some nausea.  Vomiting. He states the dizziness nausea and vomiting have improved.  He still has a headache. He states he usually does not have headache.  Migraines.  He does have a history of prostate cancer.  States that this has been treated, but his PSA is going back up.  He may need additional radiation treatment. He states that the headache is mostly behind his eyes.  He thinks is due to stress.  No visual changes, no head injury, no sinus infection symptoms, no problems with balance, speech, cognition.  He is a smoker. States he is tried Tylenol and ibuprofen without improvement  Past Medical History:  Diagnosis Date  . Allergy   . Cancer [facility])    Prostate    Patient Active Problem List   Diagnosis Date Noted  . Prostate cancer ([place]) 01/08/2017  . Idiopathic hematuria 08/20/2016  . Polyuria 08/20/2016  . Screen for STD (sexually transmitted disease) 08/20/2016  . Bursitis of right knee 08/08/2016    Past Surgical History:  Procedure Laterality Date  . ROBOT ASSISTED LAPAROSCOPIC RADICAL PROSTATECTOMY Bilateral 01/08/2017   Procedure: XI ROBOTIC ASSISTED LAPAROSCOPIC RADICAL PROSTATECTOMY WITH BILATERAL PELVIC  NODE  DISSECTON;  Surgeon: Ardis Hughs, MD;  Location: [facility];  Service: Urology;  Laterality: Bilateral;       Home Medications    Prior to Admission medications   Medication Sig Start Date End Date Taking? Authorizing Provider  butalbital-acetaminophen-caffeine (FIORICET) [phone] MG tablet Take 1-2 tablets by mouth every 6 (six) hours as needed for headache. 06/07/19 06/06/20  Raylene Everts, MD     Family History Family History  Problem Relation Age of Onset  . Stroke Father     Social History Social History   Tobacco Use  . Smoking status: Current Every Day Smoker    Packs/day: 1.00    Types: Cigarettes  . Smokeless tobacco: Never Used  Substance Use Topics  . Alcohol use: Yes    Alcohol/week: 2.0 standard drinks    Types: 2 Cans of beer per week    Comment: Daily  . Drug use: Yes    Types: Marijuana     Allergies   Wool alcohol [lanolin]   Review of Systems Review of Systems  Constitutional: Negative for chills, fever and unexpected weight change.  Eyes: Negative for photophobia and visual disturbance.  Respiratory: Negative for cough and shortness of breath.   Gastrointestinal: Positive for nausea.  Musculoskeletal: Negative for myalgias.  Neurological: Positive for dizziness and headaches.       Nausea and dizziness have resolved     Physical Exam Triage Vital Signs ED Triage Vitals  Enc Vitals Group     BP 06/07/19 1043 (!) 160/99     Pulse Rate 06/07/19 1043 83     Resp 06/07/19 1043 20     Temp 06/07/19 1043 99.2 F (37.3 C)     Temp Source 06/07/19 1043 Oral     SpO2 06/07/19 1043 100 %     Weight --      Height --  Head Circumference --      Peak Flow --      Pain Score 06/07/19 1037 8     Pain Loc --      Pain Edu? --      Excl. in [place]? --    No data found.  Updated Vital Signs BP (!) 177/83 (BP Location: Left Arm) Comment: repositioned  Pulse 83   Temp 99.2 F (37.3 C) (Oral)   Resp 20   SpO2 100%     Physical Exam Constitutional:      General: He is not in acute distress.    Appearance: He is well-developed and normal weight.     Comments: Appears thin  HENT:     Head: Normocephalic and atraumatic.     Right Ear: Tympanic membrane and ear canal normal.     Left Ear: Ear canal normal.     Nose: Nose normal.     Mouth/Throat:     Mouth: Mucous membranes are moist.     Pharynx: No posterior oropharyngeal  erythema.     Comments: Mask in place Eyes:     Extraocular Movements: Extraocular movements intact.     Conjunctiva/sclera: Conjunctivae normal.     Pupils: Pupils are equal, round, and reactive to light.     Comments: No nystagmus.  Disks flat  Cardiovascular:     Rate and Rhythm: Normal rate and regular rhythm.     Heart sounds: Normal heart sounds.  Pulmonary:     Effort: Pulmonary effort is normal. No respiratory distress.     Breath sounds: Normal breath sounds.  Musculoskeletal:        General: Normal range of motion.     Cervical back: Normal range of motion and neck supple.  Skin:    General: Skin is warm and dry.  Neurological:     General: No focal deficit present.     Mental Status: He is alert.     Cranial Nerves: No cranial nerve deficit.     Motor: No weakness.     Gait: Gait normal.     Deep Tendon Reflexes: Reflexes normal.  Psychiatric:        Mood and Affect: Mood normal.        Behavior: Behavior normal.      UC Treatments / Results  Labs (all labs ordered are listed, but only abnormal results are displayed) Labs Reviewed  NOVEL CORONAVIRUS, NAA (HOSP ORDER, SEND-OUT TO REF LAB; TAT 18-24 HRS)    EKG   Radiology No results found.  Procedures Procedures (including critical care time)  Medications Ordered in UC Medications - No data to display  Initial Impression / Assessment and Plan / UC Course  I have reviewed the triage vital signs and the nursing notes.  Pertinent labs & imaging results that were available during my care of the patient were reviewed by me and considered in my medical decision making (see chart for details).     We will repeat the coronavirus testing.  We will treat the headache.  Home to rest and push fluids.  Importance of quarantine is emphasized Final Clinical Impressions(s) / UC Diagnoses   Final diagnoses:  Acute intractable headache, unspecified headache type  Suspected COVID-19 virus infection      Discharge Instructions     Take the headache medicine as needed and lie down Drink plenty of fluids and rest You must quarantine until the test result is available Check My Chart for results  ED Prescriptions    Medication Sig Dispense Auth. Provider   butalbital-acetaminophen-caffeine (FIORICET) 50-325-40 MG tablet Take 1-2 tablets by mouth every 6 (six) hours as needed for headache. 10 tablet Raylene Everts, MD     I have reviewed the PDMP during this encounter.   Raylene Everts, MD 06/07/19 [phone]

## 2019-06-08 ENCOUNTER — Telehealth (HOSPITAL_COMMUNITY): Payer: Self-pay

## 2019-06-09 LAB — NOVEL CORONAVIRUS, NAA (HOSP ORDER, SEND-OUT TO REF LAB; TAT 18-24 HRS): SARS-CoV-2, NAA: NOT DETECTED

## 2019-11-03 ENCOUNTER — Other Ambulatory Visit: Payer: Self-pay | Admitting: Urgent Care

## 2019-11-03 DIAGNOSIS — F17209 Nicotine dependence, unspecified, with unspecified nicotine-induced disorders: Secondary | ICD-10-CM

## 2021-04-12 DIAGNOSIS — I2510 Atherosclerotic heart disease of native coronary artery without angina pectoris: Secondary | ICD-10-CM

## 2021-04-12 HISTORY — DX: Atherosclerotic heart disease of native coronary artery without angina pectoris: I25.10

## 2021-05-08 ENCOUNTER — Encounter (HOSPITAL_COMMUNITY): Payer: Self-pay | Admitting: Emergency Medicine

## 2021-05-08 ENCOUNTER — Inpatient Hospital Stay (HOSPITAL_COMMUNITY)
Admission: EM | Admit: 2021-05-08 | Discharge: 2021-05-10 | DRG: 282 | Disposition: A | Discharge status: Home / Self Care | Payer: 59 | Attending: Cardiology | Admitting: Cardiology

## 2021-05-08 ENCOUNTER — Ambulatory Visit (HOSPITAL_COMMUNITY)
Admission: EM | Admit: 2021-05-08 | Discharge: 2021-05-08 | Disposition: A | Discharge status: Home / Self Care | Payer: 59

## 2021-05-08 ENCOUNTER — Emergency Department (HOSPITAL_COMMUNITY): Payer: 59

## 2021-05-08 ENCOUNTER — Other Ambulatory Visit: Payer: Self-pay

## 2021-05-08 ENCOUNTER — Encounter (HOSPITAL_COMMUNITY): Payer: Self-pay | Admitting: Cardiology

## 2021-05-08 DIAGNOSIS — I214 Non-ST elevation (NSTEMI) myocardial infarction: Principal | ICD-10-CM | POA: Diagnosis present

## 2021-05-08 DIAGNOSIS — R0789 Other chest pain: Secondary | ICD-10-CM | POA: Diagnosis present

## 2021-05-08 DIAGNOSIS — J449 Chronic obstructive pulmonary disease, unspecified: Secondary | ICD-10-CM | POA: Diagnosis present

## 2021-05-08 DIAGNOSIS — Z8546 Personal history of malignant neoplasm of prostate: Secondary | ICD-10-CM | POA: Diagnosis not present

## 2021-05-08 DIAGNOSIS — Z8249 Family history of ischemic heart disease and other diseases of the circulatory system: Secondary | ICD-10-CM

## 2021-05-08 DIAGNOSIS — F17210 Nicotine dependence, cigarettes, uncomplicated: Secondary | ICD-10-CM | POA: Diagnosis present

## 2021-05-08 DIAGNOSIS — E785 Hyperlipidemia, unspecified: Secondary | ICD-10-CM | POA: Diagnosis not present

## 2021-05-08 DIAGNOSIS — Z720 Tobacco use: Secondary | ICD-10-CM | POA: Diagnosis not present

## 2021-05-08 DIAGNOSIS — Z20822 Contact with and (suspected) exposure to covid-19: Secondary | ICD-10-CM | POA: Diagnosis present

## 2021-05-08 DIAGNOSIS — I2510 Atherosclerotic heart disease of native coronary artery without angina pectoris: Secondary | ICD-10-CM

## 2021-05-08 DIAGNOSIS — I10 Essential (primary) hypertension: Secondary | ICD-10-CM | POA: Diagnosis present

## 2021-05-08 DIAGNOSIS — D649 Anemia, unspecified: Secondary | ICD-10-CM | POA: Diagnosis present

## 2021-05-08 DIAGNOSIS — R079 Chest pain, unspecified: Secondary | ICD-10-CM

## 2021-05-08 DIAGNOSIS — Z79899 Other long term (current) drug therapy: Secondary | ICD-10-CM

## 2021-05-08 DIAGNOSIS — R112 Nausea with vomiting, unspecified: Secondary | ICD-10-CM

## 2021-05-08 DIAGNOSIS — R7989 Other specified abnormal findings of blood chemistry: Secondary | ICD-10-CM | POA: Diagnosis not present

## 2021-05-08 DIAGNOSIS — I25110 Atherosclerotic heart disease of native coronary artery with unstable angina pectoris: Secondary | ICD-10-CM | POA: Diagnosis not present

## 2021-05-08 DIAGNOSIS — Z91048 Other nonmedicinal substance allergy status: Secondary | ICD-10-CM | POA: Diagnosis not present

## 2021-05-08 DIAGNOSIS — E782 Mixed hyperlipidemia: Secondary | ICD-10-CM | POA: Diagnosis not present

## 2021-05-08 LAB — CBC
HCT: 38.1 % — ABNORMAL LOW (ref 39.0–52.0)
Hemoglobin: 12.6 g/dL — ABNORMAL LOW (ref 13.0–17.0)
MCH: 29.4 pg (ref 26.0–34.0)
MCHC: 33.1 g/dL (ref 30.0–36.0)
MCV: 88.8 fL (ref 80.0–100.0)
Platelets: 302 10*3/uL (ref 150–400)
RBC: 4.29 MIL/uL (ref 4.22–5.81)
RDW: 13.2 % (ref 11.5–15.5)
WBC: 5.7 10*3/uL (ref 4.0–10.5)
nRBC: 0 % (ref 0.0–0.2)

## 2021-05-08 LAB — HEPARIN LEVEL (UNFRACTIONATED): Heparin Unfractionated: <0.1 IU/mL — ABNORMAL LOW (ref 0.30–0.70)

## 2021-05-08 LAB — TROPONIN I (HIGH SENSITIVITY)
Troponin I (High Sensitivity): 1182 ng/L (ref <18)
Troponin I (High Sensitivity): 1922 ng/L (ref <18)
Troponin I (High Sensitivity): 3640 ng/L (ref <18)
Troponin I (High Sensitivity): 4575 ng/L (ref <18)

## 2021-05-08 LAB — BASIC METABOLIC PANEL
Anion gap: 9 (ref 5–15)
BUN: 14 mg/dL (ref 8–23)
CO2: 25 mmol/L (ref 22–32)
Calcium: 9.6 mg/dL (ref 8.9–10.3)
Chloride: 102 mmol/L (ref 98–111)
Creatinine, Ser: 0.87 mg/dL (ref 0.61–1.24)
GFR, Estimated: >60 mL/min (ref >60)
Glucose, Bld: 109 mg/dL — ABNORMAL HIGH (ref 70–99)
Potassium: 4.2 mmol/L (ref 3.5–5.1)
Sodium: 136 mmol/L (ref 135–145)

## 2021-05-08 LAB — RESP PANEL BY RT-PCR (FLU A&B, COVID) ARPGX2
Influenza A by PCR: NEGATIVE
Influenza B by PCR: NEGATIVE
SARS Coronavirus 2 by RT PCR: NEGATIVE

## 2021-05-08 MED ORDER — ASPIRIN EC 81 MG PO TBEC
81.0000 mg | DELAYED_RELEASE_TABLET | Freq: Every day | ORAL | Status: DC
  Administered 2021-05-10: 10:00:00 81 mg via ORAL
  Filled 2021-05-08 (×2): qty 1

## 2021-05-08 MED ORDER — SODIUM CHLORIDE 0.9 % WEIGHT BASED INFUSION
3.0000 mL/kg/h | INTRAVENOUS | Status: DC
  Administered 2021-05-09: 05:00:00 3 mL/kg/h via INTRAVENOUS

## 2021-05-08 MED ORDER — HEPARIN BOLUS VIA INFUSION
3000.0000 [IU] | Freq: Once | INTRAVENOUS | Status: AC
  Administered 2021-05-08: 16:00:00 3000 [IU] via INTRAVENOUS
  Filled 2021-05-08: qty 3000

## 2021-05-08 MED ORDER — HEPARIN BOLUS VIA INFUSION
1500.0000 [IU] | Freq: Once | INTRAVENOUS | Status: AC
  Administered 2021-05-08: 23:00:00 1500 [IU] via INTRAVENOUS
  Filled 2021-05-08: qty 1500

## 2021-05-08 MED ORDER — ONDANSETRON HCL 4 MG/2ML IJ SOLN: 4.0000 mg | Freq: Four times a day (QID) | INTRAMUSCULAR | Status: DC | PRN

## 2021-05-08 MED ORDER — ACETAMINOPHEN 325 MG PO TABS: 650.0000 mg | ORAL_TABLET | ORAL | Status: DC | PRN

## 2021-05-08 MED ORDER — HEPARIN (PORCINE) 25000 UT/250ML-% IV SOLN
1000.0000 [IU]/h | INTRAVENOUS | Status: DC
  Administered 2021-05-08: 16:00:00 650 [IU]/h via INTRAVENOUS
  Filled 2021-05-08: qty 250

## 2021-05-08 MED ORDER — NITROGLYCERIN IN D5W 200-5 MCG/ML-% IV SOLN
0.0000 ug/min | INTRAVENOUS | Status: DC
Start: 2021-05-08 — End: 2021-05-10
  Administered 2021-05-08: 19:00:00 5 ug/min via INTRAVENOUS
  Filled 2021-05-08: qty 250

## 2021-05-08 MED ORDER — SODIUM CHLORIDE 0.9% FLUSH: 3.0000 mL | INTRAVENOUS | Status: DC | PRN

## 2021-05-08 MED ORDER — NITROGLYCERIN 0.4 MG SL SUBL
0.4000 mg | SUBLINGUAL_TABLET | SUBLINGUAL | Status: DC | PRN
  Administered 2021-05-08: 15:00:00 0.4 mg via SUBLINGUAL
  Filled 2021-05-08: qty 1

## 2021-05-08 MED ORDER — SODIUM CHLORIDE 0.9 % IV SOLN: 250.0000 mL | INTRAVENOUS | Status: DC | PRN

## 2021-05-08 MED ORDER — SODIUM CHLORIDE 0.9% FLUSH
3.0000 mL | Freq: Two times a day (BID) | INTRAVENOUS | Status: DC
  Administered 2021-05-09 – 2021-05-10 (×2): 3 mL via INTRAVENOUS

## 2021-05-08 MED ORDER — SODIUM CHLORIDE 0.9 % WEIGHT BASED INFUSION
1.0000 mL/kg/h | INTRAVENOUS | Status: DC
  Administered 2021-05-09: 06:00:00 1 mL/kg/h via INTRAVENOUS

## 2021-05-08 MED ORDER — ASPIRIN 81 MG PO CHEW
81.0000 mg | CHEWABLE_TABLET | ORAL | Status: AC
  Administered 2021-05-09: 06:00:00 81 mg via ORAL
  Filled 2021-05-08: qty 1

## 2021-05-08 MED ORDER — ATORVASTATIN CALCIUM 80 MG PO TABS
80.0000 mg | ORAL_TABLET | Freq: Every day | ORAL | Status: DC
  Administered 2021-05-08 – 2021-05-10 (×3): 80 mg via ORAL
  Filled 2021-05-08 (×3): qty 1

## 2021-05-08 MED ORDER — METOPROLOL TARTRATE 12.5 MG HALF TABLET
12.5000 mg | ORAL_TABLET | Freq: Two times a day (BID) | ORAL | Status: DC
  Administered 2021-05-08 – 2021-05-09 (×2): 12.5 mg via ORAL
  Filled 2021-05-08 (×2): qty 1

## 2021-05-08 MED ORDER — ASPIRIN 81 MG PO CHEW
324.0000 mg | CHEWABLE_TABLET | Freq: Once | ORAL | Status: AC
  Administered 2021-05-08: 15:00:00 324 mg via ORAL
  Filled 2021-05-08: qty 4

## 2021-05-08 MED ORDER — AMLODIPINE BESYLATE 5 MG PO TABS
5.0000 mg | ORAL_TABLET | Freq: Every day | ORAL | Status: DC
  Administered 2021-05-08 – 2021-05-10 (×3): 5 mg via ORAL
  Filled 2021-05-08 (×3): qty 1

## 2021-05-08 NOTE — Progress Notes (Signed)
ANTICOAGULATION CONSULT NOTE - Initial Consult  Pharmacy Consult for IV heparin Indication: chest pain/ACS  Allergies  Allergen Reactions   Wool Alcohol [Lanolin]     Patient Measurements: Weight: 55.3 kg (121 lb 14.6 oz) Heparin Dosing Weight: 55.3 kg  Vital Signs: Temp: 98.6 F (37 C) (12/27 1449) Temp Source: Oral (12/27 1155) BP: 154/84 (12/27 1449) Pulse Rate: 76 (12/27 1515)  Labs: Recent Labs    05/08/21 1212  HGB 12.6*  HCT 38.1*  PLT 302  CREATININE 0.87  TROPONINIHS 1,182*    CrCl cannot be calculated (Unknown ideal weight.).   Medical History: Past Medical History:  Diagnosis Date   Allergy    Cancer [facility])    Prostate    Medications:   Assessment: 60 yo M presents with chest pain since last and history of intermittent chest pain in past few months. No AC PTA. Pharmacy consulted to start heparin for ACS.  Goal of Therapy:  Heparin level 0.3-0.7 units/ml Monitor platelets by anticoagulation protocol: Yes   Plan:  Start heparin IV 3000 units x 1  Heparin IV 650 units/hr  8 h heparin level Daily heparin level and CBC   Bess Harvest Kam Rahimi 05/08/2021,3:16 PM

## 2021-05-08 NOTE — ED Notes (Signed)
Cards paged about nitro drip. Pt is pain free since nitro SL and BP stable.

## 2021-05-08 NOTE — Progress Notes (Addendum)
ANTICOAGULATION CONSULT NOTE - Initial Consult  Pharmacy Consult for IV heparin Indication: chest pain/ACS  Allergies  Allergen Reactions   Wool Alcohol [Lanolin]     Patient Measurements: Weight: 55.3 kg (121 lb 14.6 oz) Heparin Dosing Weight: 55.3 kg  Vital Signs: Temp: 98.6 F (37 C) (12/27 1449) Temp Source: Oral (12/27 1155) BP: 140/86 (12/27 2245) Pulse Rate: 86 (12/27 2245)  Labs: Recent Labs    05/08/21 1212 05/08/21 1507 05/08/21 1852 05/08/21 2046 05/08/21 2241  HGB 12.6*  --   --   --   --   HCT 38.1*  --   --   --   --   PLT 302  --   --   --   --   HEPARINUNFRC  --   --   --   --  <0.10*  CREATININE 0.87  --   --   --   --   TROPONINIHS 1,182* 1,922* 3,640* 4,575*  --      CrCl cannot be calculated (Unknown ideal weight.).   Medical History: Past Medical History:  Diagnosis Date   Allergy    Cancer [facility])    Prostate    Medications:   Assessment: 60 yo M presents with chest pain since last and history of intermittent chest pain in past few months. No AC PTA. Pharmacy consulted to start heparin for ACS.  Heparin level back is undetectable.   Goal of Therapy:  Heparin level 0.3-0.7 units/ml Monitor platelets by anticoagulation protocol: Yes   Plan:  Bolus heparin 1500 units now Increase heparin to 850 units/hr  6 h heparin level Daily heparin level and CBC   Lorelei Pont, PharmD, BCPS 05/08/2021 11:22 PM ED Clinical Pharmacist -  [phone]

## 2021-05-08 NOTE — ED Provider Notes (Signed)
[facility]    CSN: [id number] Arrival date & time: 05/08/21  1025      History   Chief Complaint Chief Complaint  Patient presents with   Chest Pain    HPI Austin Hawkins is a 60 y.o. male.   Patient presents today with a 12-hour history of worsening chest pain.  He reports intermittent chest pain over the past 6 months that is generally much more mild.  Yesterday after work he had an extreme episode with pain rated 8 on a 0-10 pain scale, localized to left anterior chest, described as pressure, no aggravating relieving factors identified.  He did not try any medication for symptom management.  He reports that symptoms improved and he tried to go to work today but then had worsening symptoms with activity prompting evaluation.  Currently pain is rated 2 on a 0-10 pain scale, localized to left chest, described as discomfort, no aggravating or alleviating factors noted.  Reports that this morning he had an episode of nausea and vomiting as well as lightheadedness but the symptoms have resolved.  He denies any history of cardiovascular disease but does have a family history in his mother and sister.  He is a cigarette smoker and has a history of hypertension; denies history of diabetes or hyperlipidemia.   Past Medical History:  Diagnosis Date   Allergy    Cancer [facility])    Prostate    Patient Active Problem List   Diagnosis Date Noted   Prostate cancer ([place]) 01/08/2017   Idiopathic hematuria 08/20/2016   Polyuria 08/20/2016   Screen for STD (sexually transmitted disease) 08/20/2016   Bursitis of right knee 08/08/2016    Past Surgical History:  Procedure Laterality Date   ROBOT ASSISTED LAPAROSCOPIC RADICAL PROSTATECTOMY Bilateral 01/08/2017   Procedure: XI ROBOTIC ASSISTED LAPAROSCOPIC RADICAL PROSTATECTOMY WITH BILATERAL PELVIC  NODE  DISSECTON;  Surgeon: Ardis Hughs, MD;  Location: [facility];  Service: Urology;  Laterality: Bilateral;       Home  Medications    Prior to Admission medications   Medication Sig Start Date End Date Taking? Authorizing Provider  amLODipine (NORVASC) 5 MG tablet amlodipine 5 mg tablet  TAKE 1 TABLET BY MOUTH EVERY DAY    [provider]  cephALEXin (KEFLEX) 500 MG capsule cephalexin 500 mg capsule  TAKE 1 CAPSULE BY MOUTH EVERY 12 HOURS FOR 10 DAYS    [provider]    Family History Family History  Problem Relation Age of Onset   Stroke Father     Social History Social History   Tobacco Use   Smoking status: Every Day    Packs/day: 1.00    Types: Cigarettes   Smokeless tobacco: Never  Substance Use Topics   Alcohol use: Yes    Alcohol/week: 2.0 standard drinks    Types: 2 Cans of beer per week    Comment: Daily   Drug use: Yes    Types: Marijuana     Allergies   Wool alcohol [lanolin]   Review of Systems Review of Systems  Constitutional:  Positive for activity change. Negative for appetite change, fatigue and fever.  Respiratory:  Negative for cough and shortness of breath.   Cardiovascular:  Positive for chest pain. Negative for palpitations and leg swelling.  Gastrointestinal:  Positive for nausea and vomiting. Negative for abdominal pain and diarrhea.  Neurological:  Positive for light-headedness. Negative for dizziness and headaches.    Physical Exam Triage Vital Signs ED Triage Vitals  Enc Vitals Group     BP 05/08/21 1111 (!) 168/81     Pulse Rate 05/08/21 1111 63     Resp 05/08/21 1111 18     Temp 05/08/21 1111 98.2 F (36.8 C)     Temp Source 05/08/21 1111 Oral     SpO2 05/08/21 1111 100 %     Weight --      Height --      Head Circumference --      Peak Flow --      Pain Score 05/08/21 1110 2     Pain Loc --      Pain Edu? --      Excl. in [place]? --    No data found.  Updated Vital Signs BP (!) 168/81 (BP Location: Left Arm)    Pulse 63    Temp 98.2 F (36.8 C) (Oral)    Resp 18    SpO2 100%   Visual Acuity Right Eye Distance:    Left Eye Distance:   Bilateral Distance:    Right Eye Near:   Left Eye Near:    Bilateral Near:     Physical Exam Vitals reviewed.  Constitutional:      General: He is awake.     Appearance: Normal appearance. He is well-developed. He is not ill-appearing.     Comments: Very pleasant male appears stated age in no acute distress sitting comfortably in exam room  HENT:     Head: Normocephalic and atraumatic.     Mouth/Throat:     Pharynx: Uvula midline. No oropharyngeal exudate or posterior oropharyngeal erythema.  Cardiovascular:     Rate and Rhythm: Normal rate and regular rhythm.     Heart sounds: Normal heart sounds, S1 normal and S2 normal. No murmur heard. Pulmonary:     Effort: Pulmonary effort is normal.     Breath sounds: Normal breath sounds. No stridor. No wheezing, rhonchi or rales.     Comments: Clear to auscultation bilaterally Chest:     Chest wall: No deformity, swelling or tenderness.  Abdominal:     General: Bowel sounds are normal.     Palpations: Abdomen is soft.     Tenderness: There is no abdominal tenderness.  Neurological:     Mental Status: He is alert.  Psychiatric:        Behavior: Behavior is cooperative.     UC Treatments / Results  Labs (all labs ordered are listed, but only abnormal results are displayed) Labs Reviewed - No data to display  EKG   Radiology No results found.  Procedures Procedures (including critical care time)  Medications Ordered in UC Medications - No data to display  Initial Impression / Assessment and Plan / UC Course  I have reviewed the triage vital signs and the nursing notes.  Pertinent labs & imaging results that were available during my care of the patient were reviewed by me and considered in my medical decision making (see chart for details).     EKG was obtained that showed normal sinus rhythm with ventricular rate of 68 bpm without ischemic changes.  Pain is not reproducible on exam and patient  has no adventitious lung sounds consistent with pulmonary etiology.  He has several risk factors for heart disease and so we discussed that the safest thing to do would be to go to the emergency room for work-up including potentially troponins since we do not have access to this in urgent care.  Patient was  agreeable given family history and will go directly to [facility] emergency room for further evaluation and management.  Offered [facility] transport since patient walked from the bus here but he declined this due to concern for cost.  His vital signs were stable at the time of discharge.  Final Clinical Impressions(s) / UC Diagnoses   Final diagnoses:  Left-sided chest pain  Nausea and vomiting, unspecified vomiting type  Family history of heart disease     Discharge Instructions      Please go to the emergency room for further evaluation as we discussed    ED Prescriptions   None    PDMP not reviewed this encounter.   Terrilee Croak, PA-C 05/08/21 1140

## 2021-05-08 NOTE — H&P (Addendum)
Cardiology Admission History and Physical:   Patient ID: Austin Hawkins MRN: [id number]; DOB: 05/14/1960   Admission date: 05/08/2021  PCP:  Patient, No Pcp Per (Inactive)   [facility] Providers Cardiologist:  None      Chief Complaint:  Chest pain   Patient Profile:   Austin Hawkins is a 60 y.o. male with HTN, prostate cancer who is being seen 05/08/2021 for the evaluation of chest pain, elevated troponins.  History of Present Illness:   Austin Hawkins s a 60 y.o. male with HTN and a history of prostate cancer who presented to urgent care complaining of left sided chest pain.  Reports that pain has been present on and off for months, but became intense yesterday (12/26) after work. Rated an 8 out of 10 on a pain scale.  Pain did not radiate to neck, left arm, or back.  Pain was described as pressure, relieved by the patient resting in bed.  Patient reports throwing up once overnight.  He did not try any medications for symptom management.  Symptoms improved overnight, but patient developed worsening symptoms with activity this morning which prompted him to seek evaluation. Smokes cigarettes and marijuana.  Has 1 alcoholic beverage per day, usually beer. Denies cocaine use. Denies history of DM or HLD.  No cardiac history.  Reports that he underwent surgery for his prostate cancer in 2018 without chemotherapy or radiation.  EKG in the ED showed normal sinus rhythm, mild early ST upsloping in the inferior leads, without reciprocal ST depression, possible Q waves in lead II and III. CXR showed findings of COPD without acute focal process in the lungs. HSTN 1182. Na 136, K 4.2, creatinine 0.87, eGFR <60. Hemoglobin 12.6. WBC 5.7. Viral respiratory panel negative.   No PND/Orthopnea with mild End of Day Edema.   No rapid or irregular Heartbeats.  Some lightheadedness and dizziness with ocular chest pain but no syncope/near syncope or TIA/amaurosis fugax.   Past Medical History:  Diagnosis  Date   Allergy    Cancer [facility])    Prostate    Past Surgical History:  Procedure Laterality Date   ROBOT ASSISTED LAPAROSCOPIC RADICAL PROSTATECTOMY Bilateral 01/08/2017   Procedure: XI ROBOTIC ASSISTED LAPAROSCOPIC RADICAL PROSTATECTOMY WITH BILATERAL PELVIC  NODE  DISSECTON;  Surgeon: Ardis Hughs, MD;  Location: [facility];  Service: Urology;  Laterality: Bilateral;     Medications Prior to Admission: Prior to Admission medications   Medication Sig Start Date End Date Taking? Authorizing Provider  amLODipine (NORVASC) 5 MG tablet amlodipine 5 mg tablet  TAKE 1 TABLET BY MOUTH EVERY DAY    [provider]  cephALEXin (KEFLEX) 500 MG capsule cephalexin 500 mg capsule  TAKE 1 CAPSULE BY MOUTH EVERY 12 HOURS FOR 10 DAYS    [provider]     Allergies:    Allergies  Allergen Reactions   Wool Alcohol [Lanolin]     Social History:   Social History   Socioeconomic History   Marital status: Single    Spouse name: Not on file   Number of children: Not on file   Years of education: Not on file   Highest education level: Not on file  Occupational History   Not on file  Tobacco Use   Smoking status: Every Day    Packs/day: 1.00    Types: Cigarettes   Smokeless tobacco: Never  Substance and Sexual Activity   Alcohol use: Yes    Alcohol/week: 2.0 standard drinks    Types:  2 Cans of beer per week    Comment: Daily   Drug use: Yes    Types: Marijuana   Sexual activity: Not on file  Other Topics Concern   Not on file  Social History Narrative   Not on file   Social Determinants of Health   Financial Resource Strain: Not on file  Food Insecurity: Not on file  Transportation Needs: Not on file  Physical Activity: Not on file  Stress: Not on file  Social Connections: Not on file  Intimate Partner Violence: Not on file    Family History:   The patient's family history includes Coronary artery disease in his mother and sister; Stroke in his father.    Reports a family history of cardiovascular disease in his mother and sister.  Mother has a pacemaker.  Sister had coronary artery disease.  ROS:  Please see the history of present illness.  Consistent wgt loss w/o change in diet; No melena, hematochezia, hematuria.    All other ROS reviewed and negative.     Physical Exam/Data:   Vitals:   05/08/21 2045 05/08/21 2100 05/08/21 2242 05/08/21 2245  BP: (!) 143/86 (!) 151/83 138/78 140/86  Pulse: 96 69 92 86  Resp: 19 16    Temp:      TempSrc:      SpO2: 99% 98%  97%  Weight:       No intake or output data in the 24 hours ending 05/08/21 2323 Last 3 Weights 05/08/2021 01/08/2017 01/02/2017  Weight (lbs) 121 lb 14.6 oz 122 lb 122 lb 2 oz  Weight (kg) 55.3 kg 55.339 kg 55.396 kg     Body mass index is 19.09 kg/m.  General:  Well nourished, well developed, in no acute distress HEENT: normal Neck: no JVD Vascular: No carotid bruits; Distal radial and dorsalis pedis pulses 2+ bilaterally   Cardiac:  normal S1, S2; RRR; noM/R/G.  Lungs: Expiratory wheezing heard in lower lung fields.  No rales or rhonchi; non-labored. Abd: soft, nontender Ext: no edema Musculoskeletal:  No deformities, BUE and BLE strength normal and equal Skin: warm and dry  Neuro:  CNs 2-12 intact, no focal abnormalities noted Psych:  Normal affect    EKG:  The ECG that was done 05/08/21 was personally reviewed and demonstrates sinus rhythm, possible Q waves in lead II and III   Relevant CV Studies: None  Laboratory Data:  High Sensitivity Troponin:   Recent Labs  Lab 05/08/21 1212 05/08/21 1507 05/08/21 1852 05/08/21 2046  TROPONINIHS 1,182* 1,922* 3,640* 4,575*      Chemistry Recent Labs  Lab 05/08/21 1212  NA 136  K 4.2  CL 102  CO2 25  GLUCOSE 109*  BUN 14  CREATININE 0.87  CALCIUM 9.6  GFRNONAA >60  ANIONGAP 9    No results for input(s): PROT, ALBUMIN, AST, ALT, ALKPHOS, BILITOT in the last 168 hours. Lipids No results for  input(s): CHOL, TRIG, HDL, LABVLDL, LDLCALC, CHOLHDL in the last 168 hours. Hematology Recent Labs  Lab 05/08/21 1212  WBC 5.7  RBC 4.29  HGB 12.6*  HCT 38.1*  MCV 88.8  MCH 29.4  MCHC 33.1  RDW 13.2  PLT 302   Thyroid No results for input(s): TSH, FREET4 in the last 168 hours. BNPNo results for input(s): BNP, PROBNP in the last 168 hours.  DDimer No results for input(s): DDIMER in the last 168 hours.   Radiology/Studies:  DG Chest 2 View  Result Date: 05/08/2021 CLINICAL DATA:  cp EXAM: CHEST - 2 VIEW COMPARISON:  None. FINDINGS: The lungs are hyperinflated with flattening of the diaphragms. The cardiomediastinal silhouette is within normal limits. No pleural effusion. No pneumothorax. No mass or consolidation. Suspect bilateral nipple shadows are present. No acute osseous abnormality. IMPRESSION: 1. Findings of COPD without acute focal process in the lungs. 2. Suspected bilateral nipple shadows. Recommend repeat film with nipple markers to confirm. Electronically Signed   By: Albin Felling M.D.   On: 05/08/2021 12:37     Assessment and Plan:   NSTEMI: HSTN 1182>>1922. Patient complained of 8/10 left sided chest pressure that was relieved with rest and worse when the patient was working. Patient is not currently in pain. CXR showed findings of COPD without acute focal process in lungs.  - Started on IV heparin - Given asprin 369m, start daily aspirin 81 mg   - Start metoprolol tartrate 12.5 mg BID  - Start lipitor 40 mg daily  - Order echocardiogram  - Order lipid panel and lfts  - Plan for cath tomorrow, NPO at midnight - Start IV nitro to maintain CP free - Risks and benefits of cardiac catheterization have been explained to the patient including but not limited to 1/200 chance of bleeding, 1/500 chance of kidney injury or arrhythmia, 05/998 chance of stroke, heart attack, or loss of life.   HTN  - Continue home dose of amlodipine 5 mg daily - Add metoprolol tartrate  12.5 mg BID  along with NTG gtt  Signed, DGlenetta Hew MD  05/08/2021 11:23 PM    ATTENDING ATTESTATION  I have seen, examined and evaluated the patient this PM in the ER along with KVikki Ports PA-C.  After reviewing all the available data and chart, we discussed the patients laboratory, study & physical findings as well as symptoms in detail. I agree with her findings, examination as well as impression recommendations as per our discussion.    Attending adjustments noted in italics.   60year old gentleman with hypertension and smoking history who presents with several months worth of ongoing progressively worsening exertional chest tightness and dyspnea who now presents with prolonged resting chest pain last night and persisting into today.  Currently chest pain-free.  Elevated troponins consistent with non-ST elevation MI.  Principal Problem:   NSTEMI (non-ST elevated myocardial infarction) ([facility] Active Problems:   Essential hypertension    Agree with plan to admit for planned cardiac catheterization.  We will place on IV heparin and IV nitroglycerin with aspirin and statin.  Attempt low-dose beta-blocker, but likely anticipate titration of beta-blocker plus or minus addition of ARB while inpatient.    Shared Decision Making/Informed Consent for LEFT HEART CATHETERIZATION WITH CORONARY ANGIOGRAPHY AND PERCUTANEOUS CORONARY INTERVENTIION. The risks [stroke (1 in 1000), death (1 in 182, kidney failure [usually temporary] (1 in 500), bleeding (1 in 200), allergic reaction [possibly serious] (1 in 200)], benefits (diagnostic support and management of coronary artery disease) and alternatives of a cardiac catheterization were discussed in detail with Mr. ESchweitzerand he is willing to proceed.     DGlenetta Hew M.D., M.S. Interventional Cardiologist   Pager # [phone] # [phone] [address] [place] [id number]   Risk Assessment/Risk  Scores:   TIMI Risk Score for Unstable Angina or Non-ST Elevation MI:   The patient's TIMI risk score is 3, which indicates a 13% risk of all cause mortality, new or recurrent myocardial infarction or need for urgent revascularization in the next 14 days.  Severity of Illness: The appropriate patient status for this patient is INPATIENT. Inpatient status is judged to be reasonable and necessary in order to provide the required intensity of service to ensure the patient's safety. The patient's presenting symptoms, physical exam findings, and initial radiographic and laboratory data in the context of their chronic comorbidities is felt to place them at high risk for further clinical deterioration. Furthermore, it is not anticipated that the patient will be medically stable for discharge from the hospital within 2 midnights of admission.   * I certify that at the point of admission it is my clinical judgment that the patient will require inpatient hospital care spanning beyond 2 midnights from the point of admission due to high intensity of service, high risk for further deterioration and high frequency of surveillance required.*   For questions or updates, please contact [place] Please consult [email or web address] for contact info under

## 2021-05-08 NOTE — ED Provider Notes (Signed)
[facility] EMERGENCY DEPARTMENT Provider Note   CSN: [id number] Arrival date & time: 05/08/21  1142     History Chief Complaint  Patient presents with   Chest Pain    Austin Hawkins is a 60 y.o. male.   Chest Pain  This patient is a 60 year old male, he has a history of prostate cancer and hypertension, he believes that he does take a single blood pressure medication which he thinks is amlodipine.  He does endorse smoking cigarettes about 1 pack/day and smoking marijuana.  He denies any cocaine use.  He reports he has been having chest pain for about 1 month which seems intermittent however last night somewhere between 9:00 and 12:00 midnight he developed severe left-sided chest pain that made it very difficult for him to go to bed.  When he woke up this morning the pain was less intense however it was still present, he tried to go to work but it continued at work so he decided to go to urgent care.  Urgent care did an EKG and sent him to the emergency department.  He was not given any medications prehospital.  Symptoms are persistent, left-sided, pressure and a feeling of shortness of breath when he tries to breathe, he denies coughing fever swelling of the legs nausea or vomiting at this time, he did vomit this morning before he got to the hospital.  He denies any pain radiation to his jaw or his shoulder or his back.  He has never had a heart attack.  Past Medical History:  Diagnosis Date   Allergy    Cancer [facility])    Prostate    Patient Active Problem List   Diagnosis Date Noted   Prostate cancer ([place]) 01/08/2017   Idiopathic hematuria 08/20/2016   Polyuria 08/20/2016   Screen for STD (sexually transmitted disease) 08/20/2016   Bursitis of right knee 08/08/2016    Past Surgical History:  Procedure Laterality Date   ROBOT ASSISTED LAPAROSCOPIC RADICAL PROSTATECTOMY Bilateral 01/08/2017   Procedure: XI ROBOTIC ASSISTED LAPAROSCOPIC RADICAL PROSTATECTOMY  WITH BILATERAL PELVIC  NODE  DISSECTON;  Surgeon: Ardis Hughs, MD;  Location: [facility];  Service: Urology;  Laterality: Bilateral;       Family History  Problem Relation Age of Onset   Stroke Father     Social History   Tobacco Use   Smoking status: Every Day    Packs/day: 1.00    Types: Cigarettes   Smokeless tobacco: Never  Substance Use Topics   Alcohol use: Yes    Alcohol/week: 2.0 standard drinks    Types: 2 Cans of beer per week    Comment: Daily   Drug use: Yes    Types: Marijuana    Home Medications Prior to Admission medications   Medication Sig Start Date End Date Taking? Authorizing Provider  amLODipine (NORVASC) 5 MG tablet amlodipine 5 mg tablet  TAKE 1 TABLET BY MOUTH EVERY DAY    [provider]  cephALEXin (KEFLEX) 500 MG capsule cephalexin 500 mg capsule  TAKE 1 CAPSULE BY MOUTH EVERY 12 HOURS FOR 10 DAYS    [provider]    Allergies    Wool alcohol [lanolin]  Review of Systems   Review of Systems  Cardiovascular:  Positive for chest pain.  All other systems reviewed and are negative.  Physical Exam Updated Vital Signs BP (!) 154/84 (BP Location: Left Arm)    Pulse 76    Temp 98.6 F (37 C)  Resp (!) 31    Wt 55.3 kg    SpO2 100%    BMI 19.09 kg/m   Physical Exam Vitals and nursing note reviewed.  Constitutional:      General: He is not in acute distress.    Appearance: He is well-developed.  HENT:     Head: Normocephalic and atraumatic.     Mouth/Throat:     Pharynx: No oropharyngeal exudate.  Eyes:     General: No scleral icterus.       Right eye: No discharge.        Left eye: No discharge.     Conjunctiva/sclera: Conjunctivae normal.     Pupils: Pupils are equal, round, and reactive to light.  Neck:     Thyroid: No thyromegaly.     Vascular: No JVD.  Cardiovascular:     Rate and Rhythm: Regular rhythm. Tachycardia present.     Heart sounds: Normal heart sounds. No murmur heard.   No friction rub.  No gallop.     Comments: HR of 100 Pulmonary:     Effort: Pulmonary effort is normal. No respiratory distress.     Breath sounds: Normal breath sounds. No wheezing or rales.  Abdominal:     General: Bowel sounds are normal. There is no distension.     Palpations: Abdomen is soft. There is no mass.     Tenderness: There is no abdominal tenderness.  Musculoskeletal:        General: No tenderness. Normal range of motion.     Cervical back: Normal range of motion and neck supple.     Right lower leg: No edema.     Left lower leg: No edema.  Lymphadenopathy:     Cervical: No cervical adenopathy.  Skin:    General: Skin is warm and dry.     Findings: No erythema or rash.  Neurological:     General: No focal deficit present.     Mental Status: He is alert.     Coordination: Coordination normal.  Psychiatric:        Behavior: Behavior normal.    ED Results / Procedures / Treatments   Labs (all labs ordered are listed, but only abnormal results are displayed) Labs Reviewed  BASIC METABOLIC PANEL - Abnormal; Notable for the following components:      Result Value   Glucose, Bld 109 (*)    All other components within normal limits  CBC - Abnormal; Notable for the following components:   Hemoglobin 12.6 (*)    HCT 38.1 (*)    All other components within normal limits  TROPONIN I (HIGH SENSITIVITY) - Abnormal; Notable for the following components:   Troponin I (High Sensitivity) 1,182 (*)    All other components within normal limits  RESP PANEL BY RT-PCR (FLU A&B, COVID) ARPGX2  HEPARIN LEVEL (UNFRACTIONATED)  TROPONIN I (HIGH SENSITIVITY)    EKG EKG Interpretation  Date/Time:  Tuesday May 08 2021 11:56:35 EST Ventricular Rate:  74 PR Interval:  132 QRS Duration: 66 QT Interval:  348 QTC Calculation: 386 R Axis:   48 Text Interpretation: Normal sinus rhythm Right atrial enlargement Borderline ECG inferior early repolarization peaked t waves anterior. no old  comparison Confirmed by Charlesetta Shanks [phone]) on 05/08/2021 12:00:50 PM   EKG Interpretation  Date/Time:  Tuesday May 08 2021 15:04:51 EST Ventricular Rate:  101 PR Interval:  146 QRS Duration: 75 QT Interval:  309 QTC Calculation: 401 R Axis:   51 Text Interpretation:  Sinus tachycardia Biatrial enlargement ST elevation, consider inferior injury since last tracing no significant change Confirmed by Noemi Chapel [phone]) on 05/08/2021 3:17:52 PM         Radiology DG Chest 2 View  Result Date: 05/08/2021 CLINICAL DATA:  cp EXAM: CHEST - 2 VIEW COMPARISON:  None. FINDINGS: The lungs are hyperinflated with flattening of the diaphragms. The cardiomediastinal silhouette is within normal limits. No pleural effusion. No pneumothorax. No mass or consolidation. Suspect bilateral nipple shadows are present. No acute osseous abnormality. IMPRESSION: 1. Findings of COPD without acute focal process in the lungs. 2. Suspected bilateral nipple shadows. Recommend repeat film with nipple markers to confirm. Electronically Signed   By: Albin Felling M.D.   On: 05/08/2021 12:37    Procedures .Critical Care Performed by: Noemi Chapel, MD Authorized by: Noemi Chapel, MD   Critical care provider statement:    Critical care time (minutes):  30   Critical care was necessary to treat or prevent imminent or life-threatening deterioration of the following conditions:  Cardiac failure   Critical care was time spent personally by me on the following activities:  Development of treatment plan with patient or surrogate, discussions with consultants, evaluation of patient's response to treatment, examination of patient, ordering and review of laboratory studies, ordering and review of radiographic studies, ordering and performing treatments and interventions, pulse oximetry, re-evaluation of patient's condition and review of old charts Comments:         Medications Ordered in ED Medications   nitroGLYCERIN (NITROSTAT) SL tablet 0.4 mg (has no administration in time range)  heparin bolus via infusion 3,000 Units (has no administration in time range)  heparin ADULT infusion 100 units/mL (25000 units/24mL) (has no administration in time range)  aspirin chewable tablet 324 mg (324 mg Oral Given 05/08/21 1515)    ED Course  I have reviewed the triage vital signs and the nursing notes.  Pertinent labs & imaging results that were available during my care of the patient were reviewed by me and considered in my medical decision making (see chart for details).    MDM Rules/Calculators/A&P                          This patient presents to the ED for concern of chest pain, this involves an extensive number of treatment options, and is a complaint that carries with it a high risk of complications and morbidity.  The differential diagnosis includes acute coronary syndrome, unstable angina, pulmonary embolism, aortic dissection, the latter seem less likely.   Additional history obtained:  Additional history obtained from medical record, the patient has had prior EKG done in 2017 which did not show any significant ST elevation focally External records from outside source obtained and reviewed including prior medical record and prior EKGs   Lab Tests:  I Ordered, reviewed, and interpreted labs.  The pertinent results include: Troponin which is over 4332, metabolic panel with minimal hyperglycemia, CBC with no leukocytosis and a mild anemia of 10.7, troponin is 1182.   Imaging Studies ordered:  I ordered imaging studies including portable chest x-ray I independently visualized and interpreted imaging which showed no acute findings, mild COPD but no acute infiltrates I agree with the radiologist interpretation   Cardiac Monitoring:  The patient was maintained on a cardiac monitor.  I personally viewed and interpreted the cardiac monitored which showed an underlying rhythm of: Sinus  tachycardia rate of 100   Medicines ordered  and prescription drug management:  I ordered medication including aspirin and nitroglycerin as well as a heparin drip for acute coronary syndrome Reevaluation of the patient after these medicines showed that the patient stayed the same I have reviewed the patients home medicines and have made adjustments as needed   Critical Interventions:  Repeat EKG Labs showing elevated troponin Pharmacy consult for heparin Cardiology consultation for admission and cardiology stabilizing care including possible catheterization   ED Course:  Immediately upon arrival the patient was given an EKG and cardiology consultation.  He was given aspirin nitroglycerin and heparin.  He has had chest pain that is been going on for approximately 15 hours, seems to be easing off but still present.  Associated with this elevated troponin is some ST abnormalities in his inferior leads those these have seen before on prior EKGs and there is no reciprocal changes.   Consultations Obtained:  I requested consultation with the cardiology,  and discussed lab and imaging findings as well as pertinent plan - they recommend: cardiac admission - and heparin   Reevaluation:  After the interventions noted above, I reevaluated the patient and found that they have :stayed the same   Dispostion:  After consideration of the diagnostic results and the patients response to treatment feel that the patent would benefit from admission to high level of care - Pt is critically ill.       Final Clinical Impression(s) / ED Diagnoses Final diagnoses:  NSTEMI (non-ST elevated myocardial infarction) ([place])  Essential hypertension  Anemia, unspecified type     Noemi Chapel, MD 05/08/21 [phone]

## 2021-05-08 NOTE — ED Triage Notes (Signed)
Pt is present today with left side chest pain. Pt states that the pain has been off and on for months but last night became intense. Pt also states that he has been feeling lightheadedness,nausea, and vomiting.

## 2021-05-08 NOTE — ED Notes (Signed)
M[place], [place] paged and made aware about pt troponin levels increasing. Pt is in no pain at this time.

## 2021-05-08 NOTE — ED Notes (Signed)
Patient is being discharged from the Urgent Care and sent to the Emergency Department via POV . Per Verna Czech PA , patient is in need of higher level of care due to chest pain. Patient is aware and verbalizes understanding of plan of care.  Vitals:   05/08/21 1111  BP: (!) 168/81  Pulse: 63  Resp: 18  Temp: 98.2 F (36.8 C)  SpO2: 100%

## 2021-05-08 NOTE — ED Notes (Signed)
Paged cards about needing new bed request for progressive unit since pt will be started on nitro drip.

## 2021-05-08 NOTE — Discharge Instructions (Signed)
Please go to the emergency room for further evaluation as we discussed

## 2021-05-08 NOTE — ED Triage Notes (Signed)
Pt sent from UC for further eval of L sided non radiating chest pain onset last night. Has had this problem intermittently x several months. Reports nausea this morning.

## 2021-05-08 NOTE — ED Provider Notes (Signed)
Emergency Medicine Provider Triage Evaluation Note  Austin Hawkins , a 60 y.o. male  was evaluated in triage.  Pt complains of chest pain intermittently for the past few months.  He reports his recent episode started last night and is gradually improved since then, but is still constant.  He reports some nausea, but denies any shortness of breath or diaphoresis.  He reports his chest feels like "he smoked something".  Denies any numbness or tingling..  Review of Systems  Positive: Chest pain, nausea  Negative: SOB, diaphoresis,cough  Physical Exam  BP (!) 206/103 (BP Location: Right Arm)    Pulse 93    Temp 98.6 F (37 C) (Oral)    Resp 16    SpO2 100%  Gen:   Awake, no distress   Resp:  Normal effort  MSK:   Moves extremities without difficulty  Other:  RRR. Diminished lung sounds  Medical Decision Making  Medically screening exam initiated at 11:57 AM.  Appropriate orders placed.  Austin Hawkins was informed that the remainder of the evaluation will be completed by another provider, this initial triage assessment does not replace that evaluation, and the importance of remaining in the ED until their evaluation is complete.  Chest pain order set placed.   Sherrell Puller, PA-C 05/08/21 1159    Truddie Hidden, MD 05/08/21 1435

## 2021-05-09 ENCOUNTER — Encounter (HOSPITAL_COMMUNITY)
Admission: EM | Disposition: A | Discharge status: Home / Self Care | Payer: Self-pay | Source: Home / Self Care | Attending: Cardiology

## 2021-05-09 ENCOUNTER — Encounter (HOSPITAL_COMMUNITY): Payer: Self-pay | Admitting: Cardiology

## 2021-05-09 ENCOUNTER — Inpatient Hospital Stay (HOSPITAL_COMMUNITY): Payer: 59

## 2021-05-09 DIAGNOSIS — I2510 Atherosclerotic heart disease of native coronary artery without angina pectoris: Secondary | ICD-10-CM | POA: Diagnosis not present

## 2021-05-09 DIAGNOSIS — I10 Essential (primary) hypertension: Secondary | ICD-10-CM | POA: Diagnosis not present

## 2021-05-09 DIAGNOSIS — E785 Hyperlipidemia, unspecified: Secondary | ICD-10-CM

## 2021-05-09 DIAGNOSIS — I214 Non-ST elevation (NSTEMI) myocardial infarction: Secondary | ICD-10-CM | POA: Diagnosis not present

## 2021-05-09 DIAGNOSIS — R079 Chest pain, unspecified: Secondary | ICD-10-CM | POA: Diagnosis not present

## 2021-05-09 DIAGNOSIS — I25110 Atherosclerotic heart disease of native coronary artery with unstable angina pectoris: Secondary | ICD-10-CM

## 2021-05-09 HISTORY — PX: LEFT HEART CATH AND CORONARY ANGIOGRAPHY: CATH118249

## 2021-05-09 HISTORY — PX: TRANSTHORACIC ECHOCARDIOGRAM: SHX275

## 2021-05-09 LAB — CBC
HCT: 35.7 % — ABNORMAL LOW (ref 39.0–52.0)
Hemoglobin: 12.1 g/dL — ABNORMAL LOW (ref 13.0–17.0)
MCH: 29.4 pg (ref 26.0–34.0)
MCHC: 33.9 g/dL (ref 30.0–36.0)
MCV: 86.9 fL (ref 80.0–100.0)
Platelets: 299 10*3/uL (ref 150–400)
RBC: 4.11 MIL/uL — ABNORMAL LOW (ref 4.22–5.81)
RDW: 13 % (ref 11.5–15.5)
WBC: 4.6 10*3/uL (ref 4.0–10.5)
nRBC: 0 % (ref 0.0–0.2)

## 2021-05-09 LAB — BASIC METABOLIC PANEL
Anion gap: 11 (ref 5–15)
BUN: 12 mg/dL (ref 8–23)
CO2: 25 mmol/L (ref 22–32)
Calcium: 9 mg/dL (ref 8.9–10.3)
Chloride: 101 mmol/L (ref 98–111)
Creatinine, Ser: 0.91 mg/dL (ref 0.61–1.24)
GFR, Estimated: >60 mL/min (ref >60)
Glucose, Bld: 101 mg/dL — ABNORMAL HIGH (ref 70–99)
Potassium: 4.1 mmol/L (ref 3.5–5.1)
Sodium: 137 mmol/L (ref 135–145)

## 2021-05-09 LAB — ECHOCARDIOGRAM COMPLETE
Area-P 1/2: 3.27 cm2
Calc EF: 65.3 %
Height: 67.5 in
S' Lateral: 2.2 cm
Single Plane A2C EF: 71.5 %
Single Plane A4C EF: 60.2 %
Weight: 1841.28 oz

## 2021-05-09 LAB — D-DIMER, QUANTITATIVE: D-Dimer, Quant: 7.61 ug/mL-FEU — ABNORMAL HIGH (ref 0.00–0.50)

## 2021-05-09 LAB — HEPARIN LEVEL (UNFRACTIONATED): Heparin Unfractionated: 0.23 IU/mL — ABNORMAL LOW (ref 0.30–0.70)

## 2021-05-09 LAB — LIPID PANEL
Cholesterol: 163 mg/dL (ref 0–200)
HDL: 67 mg/dL (ref >40)
LDL Cholesterol: 81 mg/dL (ref 0–99)
Total CHOL/HDL Ratio: 2.4 RATIO
Triglycerides: 76 mg/dL (ref <150)
VLDL: 15 mg/dL (ref 0–40)

## 2021-05-09 LAB — HEMOGLOBIN A1C
Hgb A1c MFr Bld: 5.6 % (ref 4.8–5.6)
Mean Plasma Glucose: 114.02 mg/dL

## 2021-05-09 SURGERY — LEFT HEART CATH AND CORONARY ANGIOGRAPHY
Anesthesia: LOCAL

## 2021-05-09 MED ORDER — HEPARIN (PORCINE) IN NACL 1000-0.9 UT/500ML-% IV SOLN
INTRAVENOUS | Status: DC | PRN
  Administered 2021-05-09 (×2): 500 mL

## 2021-05-09 MED ORDER — FENTANYL CITRATE (PF) 100 MCG/2ML IJ SOLN
INTRAMUSCULAR | Status: AC
  Filled 2021-05-09: qty 2

## 2021-05-09 MED ORDER — CLOPIDOGREL BISULFATE 300 MG PO TABS
300.0000 mg | ORAL_TABLET | Freq: Once | ORAL | Status: AC
  Administered 2021-05-09: 12:00:00 300 mg via ORAL
  Filled 2021-05-09: qty 1

## 2021-05-09 MED ORDER — VERAPAMIL HCL 2.5 MG/ML IV SOLN
INTRAVENOUS | Status: DC | PRN
  Administered 2021-05-09: 10:00:00 10 mL via INTRA_ARTERIAL

## 2021-05-09 MED ORDER — SODIUM CHLORIDE 0.9% FLUSH: 3.0000 mL | INTRAVENOUS | Status: DC | PRN

## 2021-05-09 MED ORDER — MIDAZOLAM HCL 2 MG/2ML IJ SOLN
INTRAMUSCULAR | Status: AC
  Filled 2021-05-09: qty 2

## 2021-05-09 MED ORDER — FENTANYL CITRATE (PF) 100 MCG/2ML IJ SOLN
INTRAMUSCULAR | Status: DC | PRN
  Administered 2021-05-09: 50 ug via INTRAVENOUS

## 2021-05-09 MED ORDER — LIDOCAINE HCL (PF) 1 % IJ SOLN
INTRAMUSCULAR | Status: DC | PRN
  Administered 2021-05-09: 2 mL via INTRADERMAL

## 2021-05-09 MED ORDER — SODIUM CHLORIDE 0.9% FLUSH
3.0000 mL | Freq: Two times a day (BID) | INTRAVENOUS | Status: DC
  Administered 2021-05-09 – 2021-05-10 (×2): 3 mL via INTRAVENOUS

## 2021-05-09 MED ORDER — METOPROLOL TARTRATE 25 MG PO TABS
25.0000 mg | ORAL_TABLET | Freq: Two times a day (BID) | ORAL | Status: DC
  Administered 2021-05-09 – 2021-05-10 (×2): 25 mg via ORAL
  Filled 2021-05-09 (×2): qty 1

## 2021-05-09 MED ORDER — SODIUM CHLORIDE 0.9 % IV SOLN: INTRAVENOUS | Status: AC

## 2021-05-09 MED ORDER — HEPARIN SODIUM (PORCINE) 1000 UNIT/ML IJ SOLN
INTRAMUSCULAR | Status: AC
  Filled 2021-05-09: qty 10

## 2021-05-09 MED ORDER — HYDRALAZINE HCL 20 MG/ML IJ SOLN: 10.0000 mg | INTRAMUSCULAR | Status: AC | PRN

## 2021-05-09 MED ORDER — LIDOCAINE HCL (PF) 1 % IJ SOLN
INTRAMUSCULAR | Status: AC
  Filled 2021-05-09: qty 30

## 2021-05-09 MED ORDER — LABETALOL HCL 5 MG/ML IV SOLN: 10.0000 mg | INTRAVENOUS | Status: AC | PRN

## 2021-05-09 MED ORDER — MIDAZOLAM HCL 2 MG/2ML IJ SOLN
INTRAMUSCULAR | Status: DC | PRN
  Administered 2021-05-09: 2 mg via INTRAVENOUS

## 2021-05-09 MED ORDER — CLOPIDOGREL BISULFATE 75 MG PO TABS
75.0000 mg | ORAL_TABLET | Freq: Every day | ORAL | Status: DC
  Administered 2021-05-10: 10:00:00 75 mg via ORAL
  Filled 2021-05-09: qty 1

## 2021-05-09 MED ORDER — SODIUM CHLORIDE 0.9 % IV SOLN
250.0000 mL | INTRAVENOUS | Status: DC | PRN
  Administered 2021-05-09: 17:00:00 250 mL via INTRAVENOUS

## 2021-05-09 MED ORDER — IOHEXOL 350 MG/ML SOLN
INTRAVENOUS | Status: DC | PRN
  Administered 2021-05-09: 11:00:00 65 mL via INTRA_ARTERIAL

## 2021-05-09 MED ORDER — HEPARIN (PORCINE) IN NACL 1000-0.9 UT/500ML-% IV SOLN
INTRAVENOUS | Status: AC
  Filled 2021-05-09: qty 1000

## 2021-05-09 MED ORDER — HEPARIN SODIUM (PORCINE) 1000 UNIT/ML IJ SOLN
INTRAMUSCULAR | Status: DC | PRN
  Administered 2021-05-09: 4000 [IU] via INTRAVENOUS

## 2021-05-09 MED ORDER — VERAPAMIL HCL 2.5 MG/ML IV SOLN
INTRAVENOUS | Status: AC
  Filled 2021-05-09: qty 2

## 2021-05-09 SURGICAL SUPPLY — 11 items
CATH INFINITI 5 FR JL3.5 (CATHETERS) ×2 IMPLANT
CATH INFINITI JR4 5F (CATHETERS) ×2 IMPLANT
DEVICE RAD TR BAND REGULAR (VASCULAR PRODUCTS) ×2 IMPLANT
GLIDESHEATH SLEND SS 6F .021 (SHEATH) ×2 IMPLANT
GUIDEWIRE INQWIRE 1.5J.035X260 (WIRE) IMPLANT
INQWIRE 1.5J .035X260CM (WIRE) ×3
KIT HEART LEFT (KITS) ×3 IMPLANT
PACK CARDIAC CATHETERIZATION (CUSTOM PROCEDURE TRAY) ×3 IMPLANT
SYR MEDRAD MARK 7 150ML (SYRINGE) ×3 IMPLANT
TRANSDUCER W/STOPCOCK (MISCELLANEOUS) ×3 IMPLANT
TUBING CIL FLEX 10 FLL-RA (TUBING) ×3 IMPLANT

## 2021-05-09 NOTE — Care Management (Signed)
°  Transition of Care (TOC) Screening Note   Patient Details  Name: Austin Hawkins Date of Birth: 1960-06-07   Transition of Care [facility]) CM/SW Contact:    Carles Collet, RN Phone Number: 05/09/2021, 8:12 AM    Transition of Care Department [facility]) has reviewed patient we will continue to monitor patient advancement through interdisciplinary progression rounds.

## 2021-05-09 NOTE — Progress Notes (Signed)
Mobility Specialist: Progress Note   05/09/21 1724  Mobility  Activity Ambulated in hall  Level of Assistance Independent  Assistive Device None  Distance Ambulated (ft) 670 ft  Mobility Ambulated independently in hallway  Mobility Response Tolerated well  Mobility performed by Mobility specialist  $Mobility charge 1 Mobility   Pre-Mobility: 80 HR, 98% SpO2 Post-Mobility: 87 HR  Pt independent throughout with no c/o. Pt is sitting EOB with RN present in the room and is being set up to wash up. Call bell and phone at his side.   [facility] Deserea Bordley Mobility Specialist Mobility Specialist 4 [place]: [phone] Mobility Specialist 2 [place]: [phone]

## 2021-05-09 NOTE — Progress Notes (Signed)
°  Echocardiogram 2D Echocardiogram has been performed.  Austin Hawkins 05/09/2021, 1:57 PM

## 2021-05-09 NOTE — Progress Notes (Signed)
Progress Note  Patient Name: Austin Hawkins Date of Encounter: 05/09/2021  [facility] Cardiologist: New  Subjective   Plan for cardiac cath today. No further chest pain, on IV Nitro.   Inpatient Medications    Scheduled Meds:  amLODipine  5 mg Oral Daily   aspirin EC  81 mg Oral Daily   atorvastatin  80 mg Oral Daily   metoprolol tartrate  12.5 mg Oral BID   sodium chloride flush  3 mL Intravenous Q12H   Continuous Infusions:  sodium chloride     sodium chloride 1 mL/kg/hr (05/09/21 0545)   heparin 1,000 Units/hr (05/09/21 0714)   nitroGLYCERIN 5 mcg/min (05/08/21 1849)   PRN Meds: sodium chloride, acetaminophen, ondansetron (ZOFRAN) IV, sodium chloride flush   Vital Signs    Vitals:   05/09/21 0000 05/09/21 0045 05/09/21 0124 05/09/21 0900  BP: 122/86 125/77 (!) 127/96 110/72  Pulse: 71 73 90 65  Resp: 13 14    Temp:   99.3 F (37.4 C)   TempSrc:   Oral   SpO2: 97% 97% 98% 99%  Weight:   52.2 kg   Height:   5' 7.5" (1.715 m)     Intake/Output Summary (Last 24 hours) at 05/09/2021 0934 Last data filed at 05/09/2021 0643 Gross per 24 hour  Intake 552.21 ml  Output --  Net 552.21 ml   Last 3 Weights 05/09/2021 05/08/2021 01/08/2017  Weight (lbs) 115 lb 1.3 oz 121 lb 14.6 oz 122 lb  Weight (kg) 52.2 kg 55.3 kg 55.339 kg      Telemetry    NSR HR 90 - Personally Reviewed  ECG    No new - Personally Reviewed  Physical Exam   GEN: No acute distress.   Neck: No JVD Cardiac: RRR, no murmurs, rubs, or gallops.  Respiratory: Clear to auscultation bilaterally. GI: Soft, nontender, non-distended  MS: No edema; No deformity. Neuro:  Nonfocal  Psych: Normal affect   Labs    High Sensitivity Troponin:   Recent Labs  Lab 05/08/21 1212 05/08/21 1507 05/08/21 1852 05/08/21 2046  TROPONINIHS 1,182* 1,922* 3,640* 4,575*     Chemistry Recent Labs  Lab 05/08/21 1212 05/09/21 0611  NA 136 137  K 4.2 4.1  CL 102 101  CO2 25 25  GLUCOSE 109*  101*  BUN 14 12  CREATININE 0.87 0.91  CALCIUM 9.6 9.0  GFRNONAA >60 >60  ANIONGAP 9 11    Lipids  Recent Labs  Lab 05/09/21 0611  CHOL 163  TRIG 76  HDL 67  LDLCALC 81  CHOLHDL 2.4    Hematology Recent Labs  Lab 05/08/21 1212 05/09/21 0611  WBC 5.7 4.6  RBC 4.29 4.11*  HGB 12.6* 12.1*  HCT 38.1* 35.7*  MCV 88.8 86.9  MCH 29.4 29.4  MCHC 33.1 33.9  RDW 13.2 13.0  PLT 302 299   Thyroid No results for input(s): TSH, FREET4 in the last 168 hours.  BNPNo results for input(s): BNP, PROBNP in the last 168 hours.  DDimer No results for input(s): DDIMER in the last 168 hours.   Radiology    DG Chest 2 View  Result Date: 05/08/2021 CLINICAL DATA:  cp EXAM: CHEST - 2 VIEW COMPARISON:  None. FINDINGS: The lungs are hyperinflated with flattening of the diaphragms. The cardiomediastinal silhouette is within normal limits. No pleural effusion. No pneumothorax. No mass or consolidation. Suspect bilateral nipple shadows are present. No acute osseous abnormality. IMPRESSION: 1. Findings of COPD without acute focal process  in the lungs. 2. Suspected bilateral nipple shadows. Recommend repeat film with nipple markers to confirm. Electronically Signed   By: Albin Felling M.D.   On: 05/08/2021 12:37    Cardiac Studies   Echo ordered Cath today  Patient Profile     60 y.o. male with h/o HTN, prostate cancer who is being seen for chest pain and elevated troponins.  Assessment & Plan    NSTEMI - patient presented with chest pain found to have elevated trop to 4000 - plan for LHC today - continue ASA 81 mg daily - lipitor 40mg  daily, check lipid panel and A1C - echo ordered - IV nitro>>wean as able - further recs per cath  HTN - continue home dose of amlodipine 5mg  daily - metoprolol 12.38m BID added - IV NTG - Bps good    For questions or updates, please contact [facility] Please consult [email or web address] for contact info under        Signed, Kaiven Vester Ninfa Meeker, PA-C   05/09/2021, 9:34 AM

## 2021-05-09 NOTE — Interval H&P Note (Signed)
History and Physical Interval Note:  05/09/2021 10:04 AM  Austin Hawkins  has presented today for surgery, with the diagnosis of cp.  The various methods of treatment have been discussed with the patient and family. After consideration of risks, benefits and other options for treatment, the patient has consented to  Procedure(s): LEFT HEART CATH AND CORONARY ANGIOGRAPHY (N/A) as a surgical intervention.  The patient's history has been reviewed, patient examined, no change in status, stable for surgery.  I have reviewed the patient's chart and labs.  Questions were answered to the patient's satisfaction.    Cath Lab Visit (complete for each Cath Lab visit)  Clinical Evaluation Leading to the Procedure:   ACS: Yes.    Non-ACS:    Anginal Classification: CCS III  Anti-ischemic medical therapy: Minimal Therapy (1 class of medications)  Non-Invasive Test Results: No non-invasive testing performed  Prior CABG: No previous CABG        Austin Hawkins

## 2021-05-09 NOTE — Progress Notes (Signed)
ANTICOAGULATION CONSULT NOTE  Pharmacy Consult for IV heparin Indication: chest pain/ACS  Allergies  Allergen Reactions   Wool Alcohol [Lanolin]     Patient Measurements: Height: 5' 7.5" (171.5 cm) Weight: 52.2 kg (115 lb 1.3 oz) IBW/kg (Calculated) : 67.25 Heparin Dosing Weight: 55.3 kg  Vital Signs: Temp: 99.3 F (37.4 C) (12/28 0124) Temp Source: Oral (12/28 0124) BP: 127/96 (12/28 0124) Pulse Rate: 90 (12/28 0124)  Labs: Recent Labs    05/08/21 1212 05/08/21 1507 05/08/21 1852 05/08/21 2046 05/08/21 2241 05/09/21 0611  HGB 12.6*  --   --   --   --  12.1*  HCT 38.1*  --   --   --   --  35.7*  PLT 302  --   --   --   --  299  HEPARINUNFRC  --   --   --   --  <0.10* 0.23*  CREATININE 0.87  --   --   --   --   --   TROPONINIHS 1,182* 1,922* 3,640* 4,575*  --   --      Estimated Creatinine Clearance: 65 mL/min (by C-G formula based on SCr of 0.87 mg/dL).   Medical History: Past Medical History:  Diagnosis Date   Allergy    Cancer [facility])    Prostate    Medications:   Assessment: 60 yo M presents with chest pain since last and history of intermittent chest pain in past few months. No AC PTA. Pharmacy consulted to dose heparin for ACS. Plans noted for cath today -heparin level 0.23 on 850 units/hr  Goal of Therapy:  Heparin level 0.3-0.7 units/ml Monitor platelets by anticoagulation protocol: Yes   Plan:  -Increase heparin to 1000 units/hr -Will follow plans post cath  Hildred Laser, PharmD Clinical Pharmacist **Pharmacist phone directory can now be found on [email or web address] ([email or web address]).  Listed under [place].

## 2021-05-09 NOTE — Plan of Care (Signed)
Problem: Education: °Goal: Knowledge of General Education information will improve °Description: Including pain rating scale, medication(s)/side effects and non-pharmacologic comfort measures °Outcome: Completed/Met °  °

## 2021-05-10 ENCOUNTER — Encounter (HOSPITAL_COMMUNITY): Payer: Self-pay | Admitting: Cardiology

## 2021-05-10 ENCOUNTER — Other Ambulatory Visit (HOSPITAL_COMMUNITY): Payer: Self-pay

## 2021-05-10 ENCOUNTER — Inpatient Hospital Stay (HOSPITAL_COMMUNITY): Payer: 59

## 2021-05-10 DIAGNOSIS — E782 Mixed hyperlipidemia: Secondary | ICD-10-CM

## 2021-05-10 DIAGNOSIS — Z720 Tobacco use: Secondary | ICD-10-CM

## 2021-05-10 DIAGNOSIS — I214 Non-ST elevation (NSTEMI) myocardial infarction: Secondary | ICD-10-CM | POA: Diagnosis not present

## 2021-05-10 DIAGNOSIS — I10 Essential (primary) hypertension: Secondary | ICD-10-CM | POA: Diagnosis not present

## 2021-05-10 DIAGNOSIS — I2510 Atherosclerotic heart disease of native coronary artery without angina pectoris: Secondary | ICD-10-CM

## 2021-05-10 DIAGNOSIS — R7989 Other specified abnormal findings of blood chemistry: Secondary | ICD-10-CM

## 2021-05-10 DIAGNOSIS — E785 Hyperlipidemia, unspecified: Secondary | ICD-10-CM

## 2021-05-10 DIAGNOSIS — I25110 Atherosclerotic heart disease of native coronary artery with unstable angina pectoris: Secondary | ICD-10-CM | POA: Diagnosis not present

## 2021-05-10 LAB — LIPID PANEL
Cholesterol: 153 mg/dL (ref 0–200)
HDL: 60 mg/dL (ref >40)
LDL Cholesterol: 77 mg/dL (ref 0–99)
Total CHOL/HDL Ratio: 2.6 RATIO
Triglycerides: 80 mg/dL (ref <150)
VLDL: 16 mg/dL (ref 0–40)

## 2021-05-10 LAB — HEMOGLOBIN A1C
Hgb A1c MFr Bld: 5.6 % (ref 4.8–5.6)
Mean Plasma Glucose: 114.02 mg/dL

## 2021-05-10 LAB — BASIC METABOLIC PANEL
Anion gap: 7 (ref 5–15)
BUN: 15 mg/dL (ref 8–23)
CO2: 23 mmol/L (ref 22–32)
Calcium: 8.9 mg/dL (ref 8.9–10.3)
Chloride: 101 mmol/L (ref 98–111)
Creatinine, Ser: 1.11 mg/dL (ref 0.61–1.24)
GFR, Estimated: >60 mL/min (ref >60)
Glucose, Bld: 97 mg/dL (ref 70–99)
Potassium: 4 mmol/L (ref 3.5–5.1)
Sodium: 131 mmol/L — ABNORMAL LOW (ref 135–145)

## 2021-05-10 LAB — CBC
HCT: 35.3 % — ABNORMAL LOW (ref 39.0–52.0)
Hemoglobin: 12 g/dL — ABNORMAL LOW (ref 13.0–17.0)
MCH: 29.9 pg (ref 26.0–34.0)
MCHC: 34 g/dL (ref 30.0–36.0)
MCV: 88 fL (ref 80.0–100.0)
Platelets: 287 10*3/uL (ref 150–400)
RBC: 4.01 MIL/uL — ABNORMAL LOW (ref 4.22–5.81)
RDW: 13 % (ref 11.5–15.5)
WBC: 4.7 10*3/uL (ref 4.0–10.5)
nRBC: 0 % (ref 0.0–0.2)

## 2021-05-10 MED ORDER — PANTOPRAZOLE SODIUM 40 MG PO TBEC
40.0000 mg | DELAYED_RELEASE_TABLET | Freq: Every day | ORAL | 3 refills | Status: DC
  Filled 2021-05-10: qty 90, 90d supply, fill #0

## 2021-05-10 MED ORDER — ATORVASTATIN CALCIUM 80 MG PO TABS: 80.0000 mg | ORAL_TABLET | Freq: Every day | ORAL | 3 refills | Status: DC

## 2021-05-10 MED ORDER — METOPROLOL SUCCINATE ER 50 MG PO TB24
50.0000 mg | ORAL_TABLET | Freq: Every day | ORAL | 11 refills | Status: DC
  Filled 2021-05-10: qty 30, 30d supply, fill #0

## 2021-05-10 MED ORDER — IOHEXOL 350 MG/ML SOLN
80.0000 mL | Freq: Once | INTRAVENOUS | Status: AC | PRN
  Administered 2021-05-10: 12:00:00 80 mL via INTRAVENOUS

## 2021-05-10 MED ORDER — CLOPIDOGREL BISULFATE 75 MG PO TABS
75.0000 mg | ORAL_TABLET | Freq: Every day | ORAL | 3 refills | Status: DC
  Filled 2021-05-10: qty 90, 90d supply, fill #0

## 2021-05-10 MED ORDER — METOPROLOL SUCCINATE ER 50 MG PO TB24: 50.0000 mg | ORAL_TABLET | Freq: Every day | ORAL | 3 refills | Status: DC

## 2021-05-10 MED ORDER — PANTOPRAZOLE SODIUM 40 MG PO TBEC: 40.0000 mg | DELAYED_RELEASE_TABLET | Freq: Every day | ORAL | 2 refills | Status: AC

## 2021-05-10 MED ORDER — NITROGLYCERIN 0.4 MG SL SUBL
0.4000 mg | SUBLINGUAL_TABLET | SUBLINGUAL | 1 refills | Status: DC | PRN
  Filled 2021-05-10: qty 25, 1d supply, fill #0

## 2021-05-10 MED ORDER — CLOPIDOGREL BISULFATE 75 MG PO TABS: 75.0000 mg | ORAL_TABLET | Freq: Every day | ORAL | 3 refills | Status: DC

## 2021-05-10 MED ORDER — NITROGLYCERIN 0.4 MG SL SUBL: 0.4000 mg | SUBLINGUAL_TABLET | SUBLINGUAL | 2 refills | Status: DC | PRN

## 2021-05-10 MED ORDER — ASPIRIN 81 MG PO TBEC
81.0000 mg | DELAYED_RELEASE_TABLET | Freq: Every day | ORAL | 3 refills | Status: DC
  Filled 2021-05-10: qty 90, 90d supply, fill #0

## 2021-05-10 MED ORDER — ASPIRIN 81 MG PO TBEC: 81.0000 mg | DELAYED_RELEASE_TABLET | Freq: Every day | ORAL | 3 refills | Status: DC

## 2021-05-10 MED ORDER — ATORVASTATIN CALCIUM 80 MG PO TABS
80.0000 mg | ORAL_TABLET | Freq: Every day | ORAL | 3 refills | Status: DC
  Filled 2021-05-10: qty 90, 90d supply, fill #0

## 2021-05-10 NOTE — Progress Notes (Incomplete)
Hello,  The Pharmacy team is conducting a discharge transitions of care quality improvement initiative. The recommendations below are for your consideration.    Austin Hawkins is a 60 y.o. male (MRN: [id number], DOB: [birth year]) who was recently hospitalized on 05/08/2021 for NSTEMI. They are anticipated to visit your clinic for post-discharge follow-up and may benefit from assistance with medication initiation and/or access.     Relevant medication access issues which may benefit from further intervention include:   Please consider the following therapy recommendations at follow-up appointment below:     Other relevant medication issues from their recent admission include: -Consider changing his amlodipine to an ACE or an ARB   We appreciate your assistance with the implementation of these recommendations. Please let us know if there is anything we can help you with at this time.       Thank you,   Hildred Laser, PharmD Clinical Pharmacist **Pharmacist phone directory can now be found on [email or web address] ([email or web address]).  Listed under [place].

## 2021-05-10 NOTE — Discharge Instructions (Addendum)
Medication Changes: - START Aspirin 81mg  daily and Plavix 75mg  daily. - START Toprol-XL 50mg  daily. - START Lipitor 80mg  daily.  - STOP Omeprazole and START Protonix 40mg  daily.  Post NSTEMI: NO HEAVY LIFTING X 2 WEEKS. NO SEXUAL ACTIVITY X 2 WEEKS. NO DRIVING X 1 WEEK. NO SOAKING BATHS, HOT TUBS, POOLS, ETC., X 7 DAYS.  Radial Site Care: Refer to this sheet in the next few weeks. These instructions provide you with information on caring for yourself after your procedure. Your caregiver may also give you more specific instructions. Your treatment has been planned according to current medical practices, but problems sometimes occur. Call your caregiver if you have any problems or questions after your procedure. HOME CARE INSTRUCTIONS You may shower the day after the procedure. Remove the bandage (dressing) and gently wash the site with plain soap and water. Gently pat the site dry.  Do not apply powder or lotion to the site.  Do not submerge the affected site in water for 3 to 5 days.  Inspect the site at least twice daily.  Do not flex or bend the affected arm for 24 hours.  No lifting over 5 pounds (2.3 kg) for 5 days after your procedure.  Do not drive home if you are discharged the same day of the procedure. Have someone else drive you.  What to expect: Any bruising will usually fade within 1 to 2 weeks.  Blood that collects in the tissue (hematoma) may be painful to the touch. It should usually decrease in size and tenderness within 1 to 2 weeks.  SEEK IMMEDIATE MEDICAL CARE IF: You have unusual pain at the radial site.  You have redness, warmth, swelling, or pain at the radial site.  You have drainage (other than a small amount of blood on the dressing).  You have chills.  You have a fever or persistent symptoms for more than 72 hours.  You have a fever and your symptoms suddenly get worse.  Your arm becomes pale, cool, tingly, or numb.  You have heavy bleeding from the site.  Hold pressure on the site.

## 2021-05-10 NOTE — Progress Notes (Signed)
CARDIAC REHAB PHASE I   PRE:  Rate/Rhythm: 80 SR    BP: sitting 125/77    SaO2:   MODE:  Ambulation: 470 ft   POST:  Rate/Rhythm: 82 SR    BP: sitting 123/75    SaO2:   Pt up walking independently and I joined him. No c/o, feels well. Discussed MI, restrictions, diet, smoking cessation, exercise, NTG and CRPII. Pt voiced understanding. Will refer to [place], ACSM 05/10/2021 2:06 PM

## 2021-05-10 NOTE — Discharge Summary (Addendum)
Discharge Summary    Patient ID: Austin Hawkins MRN: [id number]; DOB: Jul 12, 1960  Admit date: 05/08/2021 Discharge date: 05/10/2021  PCP:  Patient, No Pcp Per (Inactive)   [facility] Providers Cardiologist: New (Dr. Ellyn Hack)  Discharge Diagnoses    Principal Problem:   NSTEMI (non-ST elevated myocardial infarction) [facility]) Active Problems:   CAD (coronary artery disease)   Essential hypertension   Hyperlipidemia   Tobacco abuse    Diagnostic Studies/Procedures    Left Cardiac Catheterization 05/09/2021:   Mid RCA lesion is 20% stenosed.   Mid LAD lesion is 30% stenosed.   2nd Mrg lesion is 100% stenosed.   Mild non-obstructive, calcified mid LAD stenosis The Circumflex and ramus intermediate branch are patent. There is a very small second obtuse marginal branch (.75 mm) that appears to be occluded.  The large dominant RCA has mild mid vessel calcified stenosis.  Normal LV filling pressure   Recommendations: He has no obstructive disease in the major epicardial vessels. There is a very small caliber obtuse marginal branch that appears to be occluded. I have reviewed the cath films with the IC team and we are in agreement that there is no lesions that are amenable to PCI. Will continue ASA, statin and beta blocker. I will load with Plavix 300 mg today and then start 75 mg daily. I would continue DAPT for one year. Continue Norvasc. Check D-dimer.    Diagnostic Dominance: Right _______________   Echocardiogram 05/09/2021: Impressions:  1. Left ventricular ejection fraction, by estimation, is 60 to 65%. The  left ventricle has normal function. The left ventricle has no regional  wall motion abnormalities. Left ventricular diastolic parameters are  consistent with Grade I diastolic  dysfunction (impaired relaxation).   2. Right ventricular systolic function is normal. The right ventricular  size is normal.   3. The mitral valve is normal in structure. No evidence of  mitral valve  regurgitation. No evidence of mitral stenosis.   4. The aortic valve is tricuspid. Aortic valve regurgitation is not  visualized. No aortic stenosis is present.   5. The inferior vena cava is normal in size with greater than 50%  respiratory variability, suggesting right atrial pressure of 3 mmHg.  _____________  Chest CTA 05/10/2021: Impression: 1. No evidence of pulmonary embolism. 2. No acute findings are noted in the thorax to account for the patient's symptoms. 3. Aortic atherosclerosis, in addition to left main and three-vessel coronary artery disease. Please note that although the presence of coronary artery calcium documents the presence of coronary artery disease, the severity of this disease and any potential stenosis cannot be assessed on this non-gated CT examination. Assessment for potential risk factor modification, dietary therapy or pharmacologic therapy may be warranted, if clinically indicated. _______________  Lower Extremity Dopplers 05/10/2021 (Preliminary Read): Summary:  RIGHT:  - There is no evidence of deep vein thrombosis in the lower extremity.  - No cystic structure found in the popliteal fossa.     LEFT:  - There is no evidence of deep vein thrombosis in the lower extremity.  - No cystic structure found in the popliteal fossa.    History of Present Illness     Austin Hawkins is a 60 y.o. male with a history of hypertension, prostate cancer, and tobacco abuse who was admitted on 05/08/2021 for NSTEMI after presenting with chest pain.   Patient presented to an Urgent Care on 05/08/2021 complaining of left sided chest pain.  He reported the pain had been present  on and off for months but became more intense on 05/07/2021 after work. He rated the pain as a 8/10 on the pain scale. Pain did not radiate to neck, left arm, or back. He described the pain as a pressure which was relieved by the patient resting in bed. He reported one episode of  vomiting overnight prior to presentation. He did not try any medications for symptom management. Symptoms improved overnight but patient developed worsening symptoms with activity on morning of presentation which prompted him to seek evaluation. Given concerning symptoms, he was advised to go to the ED for further evaluation.  EKG in the ED showed normal sinus rhythm with mild early ST upsloping in the inferior leads, without reciprocal ST depression as well as possible Q waves in lead II and III. Chest x-ray showed findings of COPD without acute focal process in the lungs. Initial high-sensitivity troponin was elevated at 1,182. Na 136, K 4.2, creatinine 0.87, eGFR <60. Hemoglobin 12.6. WBC 5.7. Viral respiratory panel negative. He was started on IV Heparin and admitted for NSTEMI.  He reported tobacco use as well as marijuana use. He reported drinking about 1 alcoholic beverage per day (usually beer). He denied any cocaine use.  Hospital Course     Consultants: None  NSTEMI Patient presented with chest pain and found to have markedly elevated troponin. High-sensitivity troponin elevated at 1,182 >> 1,922 >> 3,640 >> 4,575. Echo showed LVEF of 60-65% with normal wall motion, grade 1 diastolic dysfunction, and no significant valvular disease. LHC showed 100% stenosis of a very small OM2 (not amenable to PCI) and otherwise mild non-obstructive disease. Medical therapy was recommended. D-dimer was checked after cath and came back markedly elevated at 7.6. However, chest CTA was negative for PE and preliminary read of lower extremity dopplers was negative for DVT. He was started on DAPT with Aspirin 26m daily and Plavix 75 mg daily with plans to continue this for 1 year. Also started on beta-blocker and high-intensity statin.   Hypertension BP mostly well controlled this admission. Continue home Amlodipine 574mdaily. Started on Toprol-XL 5033maily.   Hyperlipidemia Lipid panel this admission: Total  Cholsterol 153, Triglycerides 80, HDL 60, LDL 77. LDL goal <70 given CAD. Started on Lipitor 51m19mily. Will need repeat lipid panel and LFTs in 2 months.  Tobacco Abuse Patient was educated on smoking cessation during admission.  Patient seen and evaluated by Dr. HardEllyn Hackay and determined to be stable for discharge. Outpatient follow-up has been arranged. Medications as below.  Did the patient have an acute coronary syndrome (MI, NSTEMI, STEMI, etc) this admission?:  Yes                               AHA/ACC Clinical Performance & Quality Measures: Aspirin prescribed? - Yes ADP Receptor Inhibitor (Plavix/Clopidogrel, Brilinta/Ticagrelor or Effient/Prasugrel) prescribed (includes medically managed patients)? - Yes Beta Blocker prescribed? - Yes High Intensity Statin (Lipitor 40-51mg54mCrestor 20-40mg)52mscribed? - Yes EF assessed during THIS hospitalization? - Yes For EF <40%, was ACEI/ARB prescribed? - Not Applicable (EF >/= 40%) F75%EF <40%, Aldosterone Antagonist (Spironolactone or Eplerenone) prescribed? - Not Applicable (EF >/= 40%) C91%iac Rehab Phase II ordered (including medically managed patients)? - Yes   _____________  Discharge Vitals Blood pressure 125/77, pulse 63, temperature 98.2 F (36.8 C), temperature source Oral, resp. rate 20, height 5' 7.5" (1.715 m), weight 52.2 kg, SpO2 96 %.  Filed  Weights   05/08/21 1508 05/09/21 0124  Weight: 55.3 kg 52.2 kg    Labs & Radiologic Studies    CBC Recent Labs    05/09/21 0611 05/10/21 0139  WBC 4.6 4.7  HGB 12.1* 12.0*  HCT 35.7* 35.3*  MCV 86.9 88.0  PLT 299 160   Basic Metabolic Panel Recent Labs    05/09/21 0611 05/10/21 0139  NA 137 131*  K 4.1 4.0  CL 101 101  CO2 25 23  GLUCOSE 101* 97  BUN 12 15  CREATININE 0.91 1.11  CALCIUM 9.0 8.9   Liver Function Tests No results for input(s): AST, ALT, ALKPHOS, BILITOT, PROT, ALBUMIN in the last 72 hours. No results for input(s): LIPASE, AMYLASE in the  last 72 hours. High Sensitivity Troponin:   Recent Labs  Lab 05/08/21 1212 05/08/21 1507 05/08/21 1852 05/08/21 2046  TROPONINIHS 1,182* 1,922* 3,640* 4,575*    BNP Invalid input(s): POCBNP D-Dimer Recent Labs    05/09/21 1119  DDIMER 7.61*   Hemoglobin A1C Recent Labs    05/10/21 0139  HGBA1C 5.6   Fasting Lipid Panel Recent Labs    05/10/21 0139  CHOL 153  HDL 60  LDLCALC 77  TRIG 80  CHOLHDL 2.6   Thyroid Function Tests No results for input(s): TSH, T4TOTAL, T3FREE, THYROIDAB in the last 72 hours.  Invalid input(s): FREET3 _____________  DG Chest 2 View  Result Date: 05/08/2021 CLINICAL DATA:  cp EXAM: CHEST - 2 VIEW COMPARISON:  None. FINDINGS: The lungs are hyperinflated with flattening of the diaphragms. The cardiomediastinal silhouette is within normal limits. No pleural effusion. No pneumothorax. No mass or consolidation. Suspect bilateral nipple shadows are present. No acute osseous abnormality. IMPRESSION: 1. Findings of COPD without acute focal process in the lungs. 2. Suspected bilateral nipple shadows. Recommend repeat film with nipple markers to confirm. Electronically Signed   By: Albin Felling M.D.   On: 05/08/2021 12:37   CT Angio Chest Pulmonary Embolism (PE) W or WO Contrast  Result Date: 05/10/2021 CLINICAL DATA:  60 year old male with history of chest pain and shortness of breath. Elevated D-dimer. Evaluate for pulmonary embolism. EXAM: CT ANGIOGRAPHY CHEST WITH CONTRAST TECHNIQUE: Multidetector CT imaging of the chest was performed using the standard protocol during bolus administration of intravenous contrast. Multiplanar CT image reconstructions and MIPs were obtained to evaluate the vascular anatomy. CONTRAST:  61m OMNIPAQUE IOHEXOL 350 MG/ML SOLN COMPARISON:  Chest CT 11/08/2016. FINDINGS: Cardiovascular: There are no filling defects in the pulmonary arterial tree to suggest pulmonary embolism. Heart size is normal. There is no significant  pericardial fluid, thickening or pericardial calcification. There is aortic atherosclerosis, as well as atherosclerosis of the great vessels of the mediastinum and the coronary arteries, including calcified atherosclerotic plaque in the left main, left anterior descending, left circumflex and right coronary arteries. Mediastinum/Nodes: No pathologically enlarged mediastinal or hilar lymph nodes. Esophagus is unremarkable in appearance. No axillary lymphadenopathy. Lungs/Pleura: No suspicious appearing pulmonary nodules or masses are noted. No acute consolidative airspace disease. No pleural effusions. Upper Abdomen: Aortic atherosclerosis. Musculoskeletal: There are no aggressive appearing lytic or blastic lesions noted in the visualized portions of the skeleton. Review of the MIP images confirms the above findings. IMPRESSION: 1. No evidence of pulmonary embolism. 2. No acute findings are noted in the thorax to account for the patient's symptoms. 3. Aortic atherosclerosis, in addition to left main and three-vessel coronary artery disease. Please note that although the presence of coronary artery calcium documents the presence  of coronary artery disease, the severity of this disease and any potential stenosis cannot be assessed on this non-gated CT examination. Assessment for potential risk factor modification, dietary therapy or pharmacologic therapy may be warranted, if clinically indicated. Aortic Atherosclerosis (ICD10-I70.0). Electronically Signed   By: Vinnie Langton M.D.   On: 05/10/2021 12:02   CARDIAC CATHETERIZATION  Result Date: 05/09/2021   Mid RCA lesion is 20% stenosed.   Mid LAD lesion is 30% stenosed.   2nd Mrg lesion is 100% stenosed. Mild non-obstructive, calcified mid LAD stenosis The Circumflex and ramus intermediate branch are patent. There is a very small second obtuse marginal branch (.75 mm) that appears to be occluded. The large dominant RCA has mild mid vessel calcified stenosis.  Normal LV filling pressure Recommendations: He has no obstructive disease in the major epicardial vessels. There is a very small caliber obtuse marginal branch that appears to be occluded. I have reviewed the cath films with the IC team and we are in agreement that there is no lesions that are amenable to PCI. Will continue ASA, statin and beta blocker. I will load with Plavix 300 mg today and then start 75 mg daily. I would continue DAPT for one year. Continue Norvasc. Check D-dimer.   ECHOCARDIOGRAM COMPLETE  Result Date: 05/09/2021    ECHOCARDIOGRAM REPORT   Patient Name:   Austin Hawkins Date of Exam: 05/09/2021 Medical Rec #:  [id number]      Height:       67.5 in Accession #:    [id number]     Weight:       115.1 lb Date of Birth:  11-04-1960      BSA:          1.608 m Patient Age:    34 years       BP:           110/72 mmHg Patient Gender: M              HR:           69 bpm. Exam Location:  Inpatient Procedure: 2D Echo, 3D Echo, Cardiac Doppler and Color Doppler Indications:    R07.9* Chest pain, unspecified  History:        Patient has no prior history of Echocardiogram examinations.                 Acute MI and CAD; Risk Factors:Hypertension. Cancer.  Sonographer:    Roseanna Rainbow RDCS Referring Phys: [id number] Margie Billet  Sonographer Comments: Patient is post cath. IMPRESSIONS  1. Left ventricular ejection fraction, by estimation, is 60 to 65%. The left ventricle has normal function. The left ventricle has no regional wall motion abnormalities. Left ventricular diastolic parameters are consistent with Grade I diastolic dysfunction (impaired relaxation).  2. Right ventricular systolic function is normal. The right ventricular size is normal.  3. The mitral valve is normal in structure. No evidence of mitral valve regurgitation. No evidence of mitral stenosis.  4. The aortic valve is tricuspid. Aortic valve regurgitation is not visualized. No aortic stenosis is present.  5. The inferior vena cava is  normal in size with greater than 50% respiratory variability, suggesting right atrial pressure of 3 mmHg. FINDINGS  Left Ventricle: Left ventricular ejection fraction, by estimation, is 60 to 65%. The left ventricle has normal function. The left ventricle has no regional wall motion abnormalities. The left ventricular internal cavity size was normal in size. There is  no left ventricular hypertrophy.  Left ventricular diastolic parameters are consistent with Grade I diastolic dysfunction (impaired relaxation). Indeterminate filling pressures. Right Ventricle: The right ventricular size is normal. No increase in right ventricular wall thickness. Right ventricular systolic function is normal. Left Atrium: Left atrial size was normal in size. Right Atrium: Right atrial size was normal in size. Pericardium: There is no evidence of pericardial effusion. Mitral Valve: The mitral valve is normal in structure. No evidence of mitral valve regurgitation. No evidence of mitral valve stenosis. Tricuspid Valve: The tricuspid valve is normal in structure. Tricuspid valve regurgitation is trivial. No evidence of tricuspid stenosis. Aortic Valve: The aortic valve is tricuspid. Aortic valve regurgitation is not visualized. No aortic stenosis is present. Pulmonic Valve: The pulmonic valve was normal in structure. Pulmonic valve regurgitation is not visualized. No evidence of pulmonic stenosis. Aorta: The aortic root is normal in size and structure. Venous: The inferior vena cava is normal in size with greater than 50% respiratory variability, suggesting right atrial pressure of 3 mmHg. IAS/Shunts: No atrial level shunt detected by color flow Doppler.  LEFT VENTRICLE PLAX 2D LVIDd:         3.65 cm     Diastology LVIDs:         2.20 cm     LV e' medial:    7.72 cm/s LV PW:         1.10 cm     LV E/e' medial:  13.2 LV IVS:        0.80 cm     LV e' lateral:   9.68 cm/s LVOT diam:     2.10 cm     LV E/e' lateral: 10.5 LV SV:         62 LV  SV Index:   39 LVOT Area:     3.46 cm                             3D Volume EF: LV Volumes (MOD)           3D EF:        61 % LV vol d, MOD A2C: 55.8 ml LV EDV:       96 ml LV vol d, MOD A4C: 61.8 ml LV ESV:       38 ml LV vol s, MOD A2C: 15.9 ml LV SV:        58 ml LV vol s, MOD A4C: 24.6 ml LV SV MOD A2C:     39.9 ml LV SV MOD A4C:     61.8 ml LV SV MOD BP:      39.2 ml RIGHT VENTRICLE             IVC RV S prime:     11.25 cm/s  IVC diam: 1.80 cm TAPSE (M-mode): 2.2 cm LEFT ATRIUM             Index        RIGHT ATRIUM          Index LA diam:        1.80 cm 1.12 cm/m   RA Area:     8.66 cm LA Vol (A2C):   20.9 ml 13.00 ml/m  RA Volume:   16.00 ml 9.95 ml/m LA Vol (A4C):   17.2 ml 10.70 ml/m LA Biplane Vol: 19.9 ml 12.38 ml/m  AORTIC VALVE LVOT Vmax:   101.00 cm/s LVOT Vmean:  61.300 cm/s LVOT VTI:  0.180 m  AORTA Ao Root diam: 3.30 cm Ao Asc diam:  2.70 cm MITRAL VALVE MV Area (PHT): 3.27 cm     SHUNTS MV Decel Time: 232 msec     Systemic VTI:  0.18 m MV E velocity: 102.00 cm/s  Systemic Diam: 2.10 cm MV A velocity: 105.00 cm/s MV E/A ratio:  0.97 Skeet Latch MD Electronically signed by Skeet Latch MD Signature Date/Time: 05/09/2021/3:56:55 PM    Final    VAS [place] LOWER EXTREMITY VENOUS (DVT)  Result Date: 05/10/2021  Lower Venous DVT Study Patient Name:  Austin Hawkins  Date of Exam:   05/10/2021 Medical Rec #: [id number]       Accession #:    [id number] Date of Birth: [birth year]       Patient Gender: M Patient Age:   89 years Exam Location:  [facility] Procedure:      VAS [place] LOWER EXTREMITY VENOUS (DVT) Referring Phys: CALLIE GOODRICH --------------------------------------------------------------------------------  Indications: Elevated Ddimer.  Risk Factors: None identified. Comparison Study: No prior studies. Performing Technologist: Oliver Hum RVT  Examination Guidelines: A complete evaluation includes B-mode imaging, spectral Doppler, color Doppler, and power Doppler as  needed of all accessible portions of each vessel. Bilateral testing is considered an integral part of a complete examination. Limited examinations for reoccurring indications may be performed as noted. The reflux portion of the exam is performed with the patient in reverse Trendelenburg.  +---------+---------------+---------+-----------+----------+--------------+  RIGHT     Compressibility Phasicity Spontaneity Properties Thrombus Aging  +---------+---------------+---------+-----------+----------+--------------+  CFV       Full            Yes       Yes                                    +---------+---------------+---------+-----------+----------+--------------+  SFJ       Full                                                             +---------+---------------+---------+-----------+----------+--------------+  FV Prox   Full                                                             +---------+---------------+---------+-----------+----------+--------------+  FV Mid    Full                                                             +---------+---------------+---------+-----------+----------+--------------+  FV Distal Full                                                             +---------+---------------+---------+-----------+----------+--------------+  PFV  Full                                                             +---------+---------------+---------+-----------+----------+--------------+  POP       Full            Yes       Yes                                    +---------+---------------+---------+-----------+----------+--------------+  PTV       Full                                                             +---------+---------------+---------+-----------+----------+--------------+  PERO      Full                                                             +---------+---------------+---------+-----------+----------+--------------+    +---------+---------------+---------+-----------+----------+--------------+  LEFT      Compressibility Phasicity Spontaneity Properties Thrombus Aging  +---------+---------------+---------+-----------+----------+--------------+  CFV       Full            Yes       Yes                                    +---------+---------------+---------+-----------+----------+--------------+  SFJ       Full                                                             +---------+---------------+---------+-----------+----------+--------------+  FV Prox   Full                                                             +---------+---------------+---------+-----------+----------+--------------+  FV Mid    Full                                                             +---------+---------------+---------+-----------+----------+--------------+  FV Distal Full                                                             +---------+---------------+---------+-----------+----------+--------------+  PFV       Full                                                             +---------+---------------+---------+-----------+----------+--------------+  POP       Full            Yes       Yes                                    +---------+---------------+---------+-----------+----------+--------------+  PTV       Full                                                             +---------+---------------+---------+-----------+----------+--------------+  PERO      Full                                                             +---------+---------------+---------+-----------+----------+--------------+     Summary: RIGHT: - There is no evidence of deep vein thrombosis in the lower extremity.  - No cystic structure found in the popliteal fossa.  LEFT: - There is no evidence of deep vein thrombosis in the lower extremity.  - No cystic structure found in the popliteal fossa.  *See table(s) above for measurements and observations.    Preliminary     Disposition   Patient is being discharged home today in good condition.  Follow-up Plans & Appointments     Follow-up Information     Ledora Bottcher, PA Follow up.   Specialties: Physician Assistant, Cardiology, Radiology Why: Hospital follow-up with Cardiology scheduled fro 06/07/2021 at 2:45pm. Please arrive 15 minutes early for check-in. If this date/time does not work for you, please call our office to reschedule. Contact information: [address] [place] [address] [phone]                Discharge Instructions     Amb Referral to Cardiac Rehabilitation   Complete by: As directed    Diagnosis: NSTEMI   After initial evaluation and assessments completed: Virtual Based Care may be provided alone or in conjunction with Phase 2 Cardiac Rehab based on patient barriers.: Yes   Diet - low sodium heart healthy   Complete by: As directed    Increase activity slowly   Complete by: As directed        Discharge Medications   Allergies as of 05/10/2021       Reactions   Wool Alcohol [lanolin]         Medication List     STOP taking these medications    cephALEXin 500 MG capsule Commonly known as: KEFLEX   omeprazole 20 MG capsule Commonly known as: PRILOSEC       TAKE these medications    amLODipine 5 MG tablet Commonly known as:  NORVASC Take 5 mg by mouth daily.   aspirin 81 MG EC tablet Take 1 tablet (81 mg total) by mouth daily. Swallow whole. Start taking on: May 11, 2021   atorvastatin 80 MG tablet Commonly known as: LIPITOR Take 1 tablet (80 mg total) by mouth daily. Start taking on: May 11, 2021   clopidogrel 75 MG tablet Commonly known as: PLAVIX Take 1 tablet (75 mg total) by mouth daily. Start taking on: May 11, 2021   metoprolol succinate 50 MG 24 hr tablet Commonly known as: Toprol XL Take 1 tablet (50 mg total) by mouth daily. Take with or immediately following a meal.   nitroGLYCERIN  0.4 MG SL tablet Commonly known as: Nitrostat Place 1 tablet (0.4 mg total) under the tongue every 5 (five) minutes as needed for chest pain.   pantoprazole 40 MG tablet Commonly known as: Protonix Take 1 tablet (40 mg total) by mouth daily.   triamcinolone ointment 0.1 % Commonly known as: KENALOG Apply 1 application topically in the morning and at bedtime. Uses on legs           Outstanding Labs/Studies   Repeat lipid panel and LFTs in 2 months.  Duration of Discharge Encounter   Greater than 30 minutes including physician time.  Signed, Darreld Mclean, PA-C 05/10/2021, 5:54 PM   ATTENDING ATTESTATION  I have seen, examined and evaluated the patient this afternoon along with Sande Rives, PA.  I saw him after the completion of his CTA chest and extremity venous Dopplers confirming both were negative..  After reviewing all the available data and chart, we discussed the patients laboratory, study & physical findings as well as symptoms in detail. I agree with her findings, examination as well as impression recommendations as per our discussion.    Marland Kitchen  Hospital course assessment.  Agree with outpatient discharge plan.    Glenetta Hew, M.D., M.S. Interventional Cardiologist   Pager # [phone] Phone # [phone] [address] [place], [place] [address]

## 2021-05-10 NOTE — Progress Notes (Signed)
Bilateral lower extremity venous duplex has been completed. Preliminary results can be found in CV Proc through chart review.   05/10/21 4:45 PM Austin Hawkins RVT

## 2021-05-10 NOTE — Progress Notes (Signed)
Mobility Specialist: Progress Note   05/10/21 1612  Mobility  Activity Ambulated in hall  Level of Assistance Independent  Assistive Device None  Distance Ambulated (ft) 940 ft  Mobility Ambulated independently in hallway  Mobility Response Tolerated well  Mobility performed by Mobility specialist  $Mobility charge 1 Mobility   Pt independent with mobility. Met pt as he was coming out of his room. Pt had no c/o during mobility. Encouraged continued independent ambulation.   [facility] Masha Orbach Mobility Specialist Mobility Specialist 4 [place]: [phone] Mobility Specialist 2 [place] and [place]: [phone]

## 2021-05-10 NOTE — Progress Notes (Addendum)
Progress Note  Patient Name: Austin Hawkins Date of Encounter: 05/10/2021  [facility] Cardiologist: New (Dr. Ellyn Hack)  Subjective   No acute overnight events. No chest pain or shortness of breath. D-Dimer last night came back elevated at 7.6. Plan is for chest CTA today and if negative for PE, can likely go home.  Inpatient Medications    Scheduled Meds:  amLODipine  5 mg Oral Daily   aspirin EC  81 mg Oral Daily   atorvastatin  80 mg Oral Daily   clopidogrel  75 mg Oral Daily   metoprolol tartrate  25 mg Oral BID   sodium chloride flush  3 mL Intravenous Q12H   sodium chloride flush  3 mL Intravenous Q12H   Continuous Infusions:  sodium chloride 250 mL (05/09/21 1636)   PRN Meds: sodium chloride, acetaminophen, ondansetron (ZOFRAN) IV, sodium chloride flush   Vital Signs    Vitals:   05/09/21 2118 05/10/21 0606 05/10/21 0806 05/10/21 1013  BP: 116/80 106/75 105/72 111/72  Pulse: 95 67 63 83  Resp: 18 16    Temp: 98.2 F (36.8 C) 98.4 F (36.9 C)    TempSrc: Oral Oral    SpO2: 98% 97% 97%   Weight:      Height:        Intake/Output Summary (Last 24 hours) at 05/10/2021 1023 Last data filed at 05/10/2021 5366 Gross per 24 hour  Intake 361.13 ml  Output 400 ml  Net -38.87 ml   Last 3 Weights 05/09/2021 05/08/2021 01/08/2017  Weight (lbs) 115 lb 1.3 oz 121 lb 14.6 oz 122 lb  Weight (kg) 52.2 kg 55.3 kg 55.339 kg      Telemetry    Normal sinus rhythm with rates in the 60s to 80s. - Personally Reviewed  ECG    Normal sinus rhythm, rate 89, with Q waves in inferior leads, elevated J point with upsloping ST in leads V3-V5 consistely with LVH with strain. QTc 428 ms.- Personally Reviewed  Physical Exam   GEN: No acute distress.   Neck: No JVD. Cardiac: RRR. No murmurs, rubs, or gallops. Radial pulses and distal pedal pulses 2+ and equal bilaterally. Right radial cath site soft with no signs of hematoma. Respiratory: Clear to auscultation  bilaterally. No wheezes, rhonchi, or rales. GI: Soft, non-distended, and non-tender. MS: No lower extremity edema. No deformity. Skin: Warm and dry. Neuro:  No focal deficits. Psych: Normal affect. Responds appropriately.  Labs    High Sensitivity Troponin:   Recent Labs  Lab 05/08/21 1212 05/08/21 1507 05/08/21 1852 05/08/21 2046  TROPONINIHS 1,182* 1,922* 3,640* 4,575*     Chemistry Recent Labs  Lab 05/08/21 1212 05/09/21 0611 05/10/21 0139  NA 136 137 131*  K 4.2 4.1 4.0  CL 102 101 101  CO2 25 25 23   GLUCOSE 109* 101* 97  BUN 14 12 15   CREATININE 0.87 0.91 1.11  CALCIUM 9.6 9.0 8.9  GFRNONAA >60 >60 >60  ANIONGAP 9 11 7     Lipids  Recent Labs  Lab 05/10/21 0139  CHOL 153  TRIG 80  HDL 60  LDLCALC 77  CHOLHDL 2.6    Hematology Recent Labs  Lab 05/08/21 1212 05/09/21 0611 05/10/21 0139  WBC 5.7 4.6 4.7  RBC 4.29 4.11* 4.01*  HGB 12.6* 12.1* 12.0*  HCT 38.1* 35.7* 35.3*  MCV 88.8 86.9 88.0  MCH 29.4 29.4 29.9  MCHC 33.1 33.9 34.0  RDW 13.2 13.0 13.0  PLT 302 299 287  Thyroid No results for input(s): TSH, FREET4 in the last 168 hours.  BNPNo results for input(s): BNP, PROBNP in the last 168 hours.  DDimer  Recent Labs  Lab 05/09/21 1119  DDIMER 7.61*     Radiology    DG Chest 2 View  Result Date: 05/08/2021 CLINICAL DATA:  cp EXAM: CHEST - 2 VIEW COMPARISON:  None. FINDINGS: The lungs are hyperinflated with flattening of the diaphragms. The cardiomediastinal silhouette is within normal limits. No pleural effusion. No pneumothorax. No mass or consolidation. Suspect bilateral nipple shadows are present. No acute osseous abnormality. IMPRESSION: 1. Findings of COPD without acute focal process in the lungs. 2. Suspected bilateral nipple shadows. Recommend repeat film with nipple markers to confirm. Electronically Signed   By: Albin Felling M.D.   On: 05/08/2021 12:37   CARDIAC CATHETERIZATION  Result Date: 05/09/2021   Mid RCA lesion is  20% stenosed.   Mid LAD lesion is 30% stenosed.   2nd Mrg lesion is 100% stenosed. Mild non-obstructive, calcified mid LAD stenosis The Circumflex and ramus intermediate branch are patent. There is a very small second obtuse marginal branch (.75 mm) that appears to be occluded. The large dominant RCA has mild mid vessel calcified stenosis. Normal LV filling pressure Recommendations: He has no obstructive disease in the major epicardial vessels. There is a very small caliber obtuse marginal branch that appears to be occluded. I have reviewed the cath films with the IC team and we are in agreement that there is no lesions that are amenable to PCI. Will continue ASA, statin and beta blocker. I will load with Plavix 300 mg today and then start 75 mg daily. I would continue DAPT for one year. Continue Norvasc. Check D-dimer.   ECHOCARDIOGRAM COMPLETE  Result Date: 05/09/2021    ECHOCARDIOGRAM REPORT   Patient Name:   Austin Hawkins Date of Exam: 05/09/2021 Medical Rec #:  [id number]      Height:       67.5 in Accession #:    [id number]     Weight:       115.1 lb Date of Birth:  1961/01/06      BSA:          1.608 m Patient Age:    21 years       BP:           110/72 mmHg Patient Gender: M              HR:           69 bpm. Exam Location:  Inpatient Procedure: 2D Echo, 3D Echo, Cardiac Doppler and Color Doppler Indications:    R07.9* Chest pain, unspecified  History:        Patient has no prior history of Echocardiogram examinations.                 Acute MI and CAD; Risk Factors:Hypertension. Cancer.  Sonographer:    Roseanna Rainbow RDCS Referring Phys: [id number] Margie Billet  Sonographer Comments: Patient is post cath. IMPRESSIONS  1. Left ventricular ejection fraction, by estimation, is 60 to 65%. The left ventricle has normal function. The left ventricle has no regional wall motion abnormalities. Left ventricular diastolic parameters are consistent with Grade I diastolic dysfunction (impaired relaxation).  2. Right  ventricular systolic function is normal. The right ventricular size is normal.  3. The mitral valve is normal in structure. No evidence of mitral valve regurgitation. No evidence of mitral stenosis.  4. The  aortic valve is tricuspid. Aortic valve regurgitation is not visualized. No aortic stenosis is present.  5. The inferior vena cava is normal in size with greater than 50% respiratory variability, suggesting right atrial pressure of 3 mmHg. FINDINGS  Left Ventricle: Left ventricular ejection fraction, by estimation, is 60 to 65%. The left ventricle has normal function. The left ventricle has no regional wall motion abnormalities. The left ventricular internal cavity size was normal in size. There is  no left ventricular hypertrophy. Left ventricular diastolic parameters are consistent with Grade I diastolic dysfunction (impaired relaxation). Indeterminate filling pressures. Right Ventricle: The right ventricular size is normal. No increase in right ventricular wall thickness. Right ventricular systolic function is normal. Left Atrium: Left atrial size was normal in size. Right Atrium: Right atrial size was normal in size. Pericardium: There is no evidence of pericardial effusion. Mitral Valve: The mitral valve is normal in structure. No evidence of mitral valve regurgitation. No evidence of mitral valve stenosis. Tricuspid Valve: The tricuspid valve is normal in structure. Tricuspid valve regurgitation is trivial. No evidence of tricuspid stenosis. Aortic Valve: The aortic valve is tricuspid. Aortic valve regurgitation is not visualized. No aortic stenosis is present. Pulmonic Valve: The pulmonic valve was normal in structure. Pulmonic valve regurgitation is not visualized. No evidence of pulmonic stenosis. Aorta: The aortic root is normal in size and structure. Venous: The inferior vena cava is normal in size with greater than 50% respiratory variability, suggesting right atrial pressure of 3 mmHg. IAS/Shunts: No  atrial level shunt detected by color flow Doppler.  LEFT VENTRICLE PLAX 2D LVIDd:         3.65 cm     Diastology LVIDs:         2.20 cm     LV e' medial:    7.72 cm/s LV PW:         1.10 cm     LV E/e' medial:  13.2 LV IVS:        0.80 cm     LV e' lateral:   9.68 cm/s LVOT diam:     2.10 cm     LV E/e' lateral: 10.5 LV SV:         62 LV SV Index:   39 LVOT Area:     3.46 cm                             3D Volume EF: LV Volumes (MOD)           3D EF:        61 % LV vol d, MOD A2C: 55.8 ml LV EDV:       96 ml LV vol d, MOD A4C: 61.8 ml LV ESV:       38 ml LV vol s, MOD A2C: 15.9 ml LV SV:        58 ml LV vol s, MOD A4C: 24.6 ml LV SV MOD A2C:     39.9 ml LV SV MOD A4C:     61.8 ml LV SV MOD BP:      39.2 ml RIGHT VENTRICLE             IVC RV S prime:     11.25 cm/s  IVC diam: 1.80 cm TAPSE (M-mode): 2.2 cm LEFT ATRIUM             Index        RIGHT ATRIUM  Index LA diam:        1.80 cm 1.12 cm/m   RA Area:     8.66 cm LA Vol (A2C):   20.9 ml 13.00 ml/m  RA Volume:   16.00 ml 9.95 ml/m LA Vol (A4C):   17.2 ml 10.70 ml/m LA Biplane Vol: 19.9 ml 12.38 ml/m  AORTIC VALVE LVOT Vmax:   101.00 cm/s LVOT Vmean:  61.300 cm/s LVOT VTI:    0.180 m  AORTA Ao Root diam: 3.30 cm Ao Asc diam:  2.70 cm MITRAL VALVE MV Area (PHT): 3.27 cm     SHUNTS MV Decel Time: 232 msec     Systemic VTI:  0.18 m MV E velocity: 102.00 cm/s  Systemic Diam: 2.10 cm MV A velocity: 105.00 cm/s MV E/A ratio:  0.97 Skeet Latch MD Electronically signed by Skeet Latch MD Signature Date/Time: 05/09/2021/3:56:55 PM    Final     Cardiac Studies   Left Cardiac Catheterization 05/09/2021:   Mid RCA lesion is 20% stenosed.   Mid LAD lesion is 30% stenosed.   2nd Mrg lesion is 100% stenosed.   Mild non-obstructive, calcified mid LAD stenosis The Circumflex and ramus intermediate branch are patent. There is a very small second obtuse marginal branch (.75 mm) that appears to be occluded.  The large dominant RCA has mild mid  vessel calcified stenosis.  Normal LV filling pressure   Recommendations: He has no obstructive disease in the major epicardial vessels. There is a very small caliber obtuse marginal branch that appears to be occluded. I have reviewed the cath films with the IC team and we are in agreement that there is no lesions that are amenable to PCI. Will continue ASA, statin and beta blocker. I will load with Plavix 300 mg today and then start 75 mg daily. I would continue DAPT for one year. Continue Norvasc. Check D-dimer.    _______________  Echocardiogram 05/09/2021: Impressions:  1. Left ventricular ejection fraction, by estimation, is 60 to 65%. The  left ventricle has normal function. The left ventricle has no regional  wall motion abnormalities. Left ventricular diastolic parameters are  consistent with Grade I diastolic  dysfunction (impaired relaxation).   2. Right ventricular systolic function is normal. The right ventricular  size is normal.   3. The mitral valve is normal in structure. No evidence of mitral valve  regurgitation. No evidence of mitral stenosis.   4. The aortic valve is tricuspid. Aortic valve regurgitation is not  visualized. No aortic stenosis is present.   5. The inferior vena cava is normal in size with greater than 50%  respiratory variability, suggesting right atrial pressure of 3 mmHg.   Patient Profile     60 y.o. male with a history of hypertension and prostate cancer who was admitted on 05/08/2021 for NSTEMI after presenting with chest pain.   Assessment & Plan    NSTEMI Patient presented with chest pain and found to have markedly elevated troponin. High-sensitivity troponin elevated at 1,182 >> 1,922 >> 3,640 >> 4,575. Echo showed LVEF of 60-65% with normal wall motion, grade 1 diastolic dysfunction, and no significant valvular disease. LHC showed 100% stenosis of a very small OM2 (not amenable to PCI) and otherwise mild non-obstructive disease.  - D-dimer  came back markedly elevated at 7.6. Will check chest CTA to rule out PE. If this is negative, he can likely be discharge. - Started on DAPT with Aspirin and Plavix with plans to continue this for 1  year. - Currently on Lopressor 25mg  twice daily. Will switch to Toprol-XL 50mg  daily given all of patients other medications are going to be once daily. - Continue high-intensity statin.  Hypertension BP mostly well controlled. - Continue home Amlodipine 5mg  daily. - Continue Toprol-XL 50mg  daily.  Hyperlipidemia Lipid panel this admission: Total Cholsterol 153, Triglycerides 80, HDL 60, LDL 77. LDL goal <70 given CAD. - Started on Lipitor 80mg  daily. - Repeat lipid panel and LFTs in 2 months.   For questions or updates, please contact [place]  Please consult [email or web address] for contact info under        Signed, Darreld Mclean, PA-C  05/10/2021, 10:23 AM     ATTENDING ATTESTATION  I have seen, examined and evaluated the patient this PM along with Sande Rives, PA.  After reviewing all the available data and chart, we discussed the patients laboratory, study & physical findings as well as symptoms in detail. I agree with her findings, examination as well as impression recommendations as per our discussion.     D-dimer checked post cath was elevated.  He was therefore sent for CTA chest for PE evaluation that was negative for PE.  Was followed up with ultrasound venous Dopplers showing no evidence of DVT.  As such, we will continue aspirin Plavix and not switch over to DOAC.  Likely culprit for her symptoms was a very small caliber OM 1 branch occlusion.  Continue aggressive risk stratification as there is evidence of diffuse calcified CAD.  , Beta-blocker, statin along with DAPT.  Okay for discharge now that the lower extremity venous Dopplers to confirm no DVT.    Glenetta Hew, M.D., M.S. Interventional Cardiologist   Pager # [phone] Phone # [phone] [address] [place], [place] [address]

## 2021-05-11 ENCOUNTER — Telehealth (HOSPITAL_COMMUNITY): Payer: Self-pay | Admitting: Pharmacist

## 2021-05-11 NOTE — Telephone Encounter (Signed)
Hello,  The Pharmacy team is conducting a discharge transitions of care quality improvement initiative. The recommendations below are for your consideration.    Austin Hawkins is a 60 y.o. male (MRN: [id number], DOB: [birth year]) who was recently hospitalized on 05/08/2021 for NSTEMI. They are anticipated to visit your clinic for post-discharge follow-up and may benefit from assistance with medication initiation and/or access.    Please consider the following therapy recommendations at follow-up appointment below:   -With NSTEMI and HTN, consider changing his amlodipine to an ACE or an ARB if able   We appreciate your assistance with the implementation of these recommendations. Please let us know if there is anything we can help you with at this time.       Thank you,   Hildred Laser, PharmD Clinical Pharmacist **Pharmacist phone directory can now be found on [email or web address] ([email or web address]).  Listed under [place].

## 2021-05-25 ENCOUNTER — Other Ambulatory Visit: Payer: Self-pay

## 2021-05-25 ENCOUNTER — Emergency Department (HOSPITAL_COMMUNITY): Payer: 59

## 2021-05-25 ENCOUNTER — Encounter (HOSPITAL_COMMUNITY): Payer: Self-pay | Admitting: *Deleted

## 2021-05-25 ENCOUNTER — Emergency Department (HOSPITAL_COMMUNITY)
Admission: EM | Admit: 2021-05-25 | Discharge: 2021-05-26 | Disposition: A | Discharge status: Home / Self Care | Payer: 59 | Attending: Emergency Medicine | Admitting: Emergency Medicine

## 2021-05-25 DIAGNOSIS — R079 Chest pain, unspecified: Secondary | ICD-10-CM | POA: Diagnosis not present

## 2021-05-25 DIAGNOSIS — X500XXA Overexertion from strenuous movement or load, initial encounter: Secondary | ICD-10-CM | POA: Diagnosis not present

## 2021-05-25 DIAGNOSIS — Z7902 Long term (current) use of antithrombotics/antiplatelets: Secondary | ICD-10-CM | POA: Diagnosis not present

## 2021-05-25 DIAGNOSIS — Y990 Civilian activity done for income or pay: Secondary | ICD-10-CM | POA: Insufficient documentation

## 2021-05-25 DIAGNOSIS — Z7982 Long term (current) use of aspirin: Secondary | ICD-10-CM | POA: Insufficient documentation

## 2021-05-25 LAB — CBC
HCT: 34 % — ABNORMAL LOW (ref 39.0–52.0)
Hemoglobin: 11 g/dL — ABNORMAL LOW (ref 13.0–17.0)
MCH: 29.3 pg (ref 26.0–34.0)
MCHC: 32.4 g/dL (ref 30.0–36.0)
MCV: 90.4 fL (ref 80.0–100.0)
Platelets: 340 10*3/uL (ref 150–400)
RBC: 3.76 MIL/uL — ABNORMAL LOW (ref 4.22–5.81)
RDW: 12.3 % (ref 11.5–15.5)
WBC: 4.7 10*3/uL (ref 4.0–10.5)
nRBC: 0 % (ref 0.0–0.2)

## 2021-05-25 LAB — BASIC METABOLIC PANEL
Anion gap: 9 (ref 5–15)
BUN: 11 mg/dL (ref 8–23)
CO2: 24 mmol/L (ref 22–32)
Calcium: 8.9 mg/dL (ref 8.9–10.3)
Chloride: 105 mmol/L (ref 98–111)
Creatinine, Ser: 0.99 mg/dL (ref 0.61–1.24)
GFR, Estimated: >60 mL/min (ref >60)
Glucose, Bld: 94 mg/dL (ref 70–99)
Potassium: 3.8 mmol/L (ref 3.5–5.1)
Sodium: 138 mmol/L (ref 135–145)

## 2021-05-25 LAB — TROPONIN I (HIGH SENSITIVITY)
Troponin I (High Sensitivity): 19 ng/L — ABNORMAL HIGH (ref <18)
Troponin I (High Sensitivity): 22 ng/L — ABNORMAL HIGH (ref <18)

## 2021-05-25 NOTE — ED Triage Notes (Signed)
Pt here from home via [facility] for cp that started an hour ago w/ dizziness. Pt states he had an MI at the end of December, no stents placed. But this feels similar. 130/80, 80HR, CBG 109, 99% RA, 17RR. Pt took 2 SL nitro w/ completely relief, EMS also gave 324mg  ASA.

## 2021-05-25 NOTE — ED Triage Notes (Signed)
The pt arrived by [facility] from home   the pt started having chest pain after he came home from work  he lay down and the chest pain persisted.  He took 2 sl nitro and the pain went away  no pain now.. ems gave him 3 aspirin  recent  dec 24th mi  iv per ems

## 2021-05-25 NOTE — ED Provider Triage Note (Signed)
Emergency Medicine Provider Triage Evaluation Note  Austin Hawkins , a 61 y.o. male  was evaluated in triage.  Pt complains of CP which is now resolved. States he had MI on 12/27 and hasn't had CP since cath however CP started today around 4/5pm. Was constant until took nitro x2 and his CP resolved after. Felt dizzy. No SOB, nausea or vomiting.   Review of Systems  Positive: CP Negative: SOB, N, V  Physical Exam  BP (!) 164/84 (BP Location: Right Arm)    Pulse 65    Temp 98.8 F (37.1 C) (Oral)    Resp 18    Ht 5' 0.75" (1.543 m)    Wt 52.2 kg    SpO2 100%    BMI 21.92 kg/m  Gen:   Awake, no distress   Resp:  Normal effort  MSK:   Moves extremities without difficulty  Other:  RRR, lungs clear  Medical Decision Making  Medically screening exam initiated at 7:03 PM.  Appropriate orders placed.  Austin Hawkins was informed that the remainder of the evaluation will be completed by another provider, this initial triage assessment does not replace that evaluation, and the importance of remaining in the ED until their evaluation is complete.  CP workup.    Austin Hawkins [place], [place] 05/25/21 1905

## 2021-05-26 LAB — TROPONIN I (HIGH SENSITIVITY): Troponin I (High Sensitivity): 19 ng/L — ABNORMAL HIGH (ref <18)

## 2021-05-26 NOTE — ED Provider Notes (Signed)
[facility] EMERGENCY DEPARTMENT Provider Note   CSN: [id number] Arrival date & time: 05/25/21  1847     History  Chief Complaint  Patient presents with   Chest Pain    Austin Hawkins is a 61 y.o. male.  Pt is a 61 yo bm with a hx of a NSTEMI on 12/27.  He had a cath which snowed no obstructive disease in the major epicardial vessels.  He had a very small caliber obtuse marginal branch that is occluded.  No lesions amenable to PCA.  Pt was put on asa, plavix, statin, and a beta blocker.  He said he's been compliant with meds and has stopped smoking.  He said he has been doing a lot of heavy lifting at work.  He was fine at work, but when he got home, he developed cp similar to what he had before he came to the ED.  Pt took 2 nitro with relief of cp.  EMS gave him ASA.  Pt has been here for 12.5 hrs and has not had any more cp.      Home Medications Prior to Admission medications   Medication Sig Start Date End Date Taking? Authorizing Provider  amLODipine (NORVASC) 5 MG tablet Take 5 mg by mouth daily.    [provider]  aspirin 81 MG EC tablet Take 1 tablet (81 mg total) by mouth daily. Swallow whole. 05/11/21   Sande Rives E, PA-C  atorvastatin (LIPITOR) 80 MG tablet Take 1 tablet (80 mg total) by mouth daily. 05/11/21   Darreld Mclean, PA-C  clopidogrel (PLAVIX) 75 MG tablet Take 1 tablet (75 mg total) by mouth daily. 05/11/21   Sande Rives E, PA-C  metoprolol succinate (TOPROL XL) 50 MG 24 hr tablet Take 1 tablet (50 mg total) by mouth daily. Take with or immediately following a meal. 05/10/21 05/10/22  Sande Rives E, PA-C  nitroGLYCERIN (NITROSTAT) 0.4 MG SL tablet Place 1 tablet (0.4 mg total) under the tongue every 5 (five) minutes as needed for chest pain. 05/10/21 05/10/22  Sande Rives E, PA-C  pantoprazole (PROTONIX) 40 MG tablet Take 1 tablet (40 mg total) by mouth daily. 05/10/21 08/08/21  Sande Rives E, PA-C   triamcinolone ointment (KENALOG) 0.1 % Apply 1 application topically in the morning and at bedtime. Uses on legs 04/24/21   [provider]      Allergies    Wool alcohol [lanolin]    Review of Systems   Review of Systems  Cardiovascular:  Positive for chest pain.  All other systems reviewed and are negative.  Physical Exam Updated Vital Signs BP 125/80    Pulse 66    Temp 98.4 F (36.9 C) (Oral)    Resp 13    Ht 5' 0.75" (1.543 m)    Wt 52.2 kg    SpO2 100%    BMI 21.92 kg/m  Physical Exam Vitals and nursing note reviewed.  Constitutional:      Appearance: He is well-developed.  HENT:     Head: Normocephalic and atraumatic.  Eyes:     Extraocular Movements: Extraocular movements intact.     Pupils: Pupils are equal, round, and reactive to light.  Cardiovascular:     Rate and Rhythm: Normal rate and regular rhythm.     Heart sounds: Normal heart sounds.  Pulmonary:     Effort: Pulmonary effort is normal.     Breath sounds: Normal breath sounds.  Abdominal:     General:  Bowel sounds are normal.     Palpations: Abdomen is soft.  Musculoskeletal:        General: Normal range of motion.     Cervical back: Normal range of motion and neck supple.  Skin:    General: Skin is warm.     Capillary Refill: Capillary refill takes less than 2 seconds.  Neurological:     General: No focal deficit present.     Mental Status: He is alert and oriented to person, place, and time.  Psychiatric:        Mood and Affect: Mood normal.        Behavior: Behavior normal.    ED Results / Procedures / Treatments   Labs (all labs ordered are listed, but only abnormal results are displayed) Labs Reviewed  CBC - Abnormal; Notable for the following components:      Result Value   RBC 3.76 (*)    Hemoglobin 11.0 (*)    HCT 34.0 (*)    All other components within normal limits  TROPONIN I (HIGH SENSITIVITY) - Abnormal; Notable for the following components:   Troponin I (High  Sensitivity) 19 (*)    All other components within normal limits  TROPONIN I (HIGH SENSITIVITY) - Abnormal; Notable for the following components:   Troponin I (High Sensitivity) 22 (*)    All other components within normal limits  TROPONIN I (HIGH SENSITIVITY) - Abnormal; Notable for the following components:   Troponin I (High Sensitivity) 19 (*)    All other components within normal limits  BASIC METABOLIC PANEL    EKG EKG Interpretation  Date/Time:  Saturday May 26 2021 07:25:15 EST Ventricular Rate:  68 PR Interval:  172 QRS Duration: 74 QT Interval:  383 QTC Calculation: 408 R Axis:   21 Text Interpretation: Sinus rhythm Low voltage, extremity leads ST elevation suggests acute pericarditis No significant change since last tracing Confirmed by Isla Pence [phone]) on 05/26/2021 7:41:23 AM  Radiology DG Chest 2 View  Result Date: 05/25/2021 CLINICAL DATA:  Chest pain EXAM: CHEST - 2 VIEW COMPARISON:  05/08/2021 FINDINGS: Cardiac shadow is within normal limits. Lungs are well aerated bilaterally. No focal infiltrate or sizable effusion is seen. Bilateral nipple shadows are again noted. No bony abnormality is seen. IMPRESSION: No acute abnormality noted. Electronically Signed   By: Inez Catalina M.D.   On: 05/25/2021 20:00    Procedures Procedures    Medications Ordered in ED Medications - No data to display  ED Course/ Medical Decision Making/ A&P                           Medical Decision Making  CXR and EKG are ok.  2 troponins last night are neg.    I have repeated an EKG and troponin.  They are both ok.  Pt is feeling well now.  He is stable for d/c.  He is to f/u with cards and to continue taking his meds.  Return if worse.        Final Clinical Impression(s) / ED Diagnoses Final diagnoses:  Nonspecific chest pain    Rx / DC Orders ED Discharge Orders     None         Isla Pence, MD 05/26/21 [phone]

## 2021-05-26 NOTE — ED Notes (Signed)
Pt reports that he believes he overexerted himself at work yesterday, he has a labor intensive job and believes that is the cause of his chest pain. He said he was feeling fine, went to [employer] after work and began feeling CP. He took a nitro and called his sister who instructed him to call EMS. He reports being compliant with meds.

## 2021-05-26 NOTE — ED Notes (Signed)
PT called for vitals X3 with no response. Will attempt again shortly.

## 2021-06-03 NOTE — Progress Notes (Signed)
Cardiology Office Note:    Date:  06/07/2021   ID:  Austin Hawkins, DOB 13-Oct-1960, MRN [id number]  PCP:  System, Provider Not In   [facility] Cardiologist:  Glenetta Hew, MD     Referring MD: No ref. provider found   No chief complaint on file. Annual follow-up  History of Present Illness:    Austin Hawkins is a 61 y.o. male with a hx of nonobstructive CAD, hypertension, hyperlipidemia, former tobacco abuse, and prostate cancer.  He presented to [facility] 05/07/2021 with chest pain.  He was sent to the ED and EKG there showed mild early ST upsloping in the inferior leads without reciprocal ST depression as well as possible Q waves in lead II and III.  HST elevated to 4575.  Echocardiogram with LVEF 60 to 65% with normal wall motion, grade 1 diastolic dysfunction, and no significant valvular disease.  He underwent left heart catheterization that showed 100% stenosis of a very small OM 2 not amenable to PCI and otherwise mild nonobstructive disease.  Medical therapy was recommended and he was discharged on 05/09/2021.  Unfortunately he presented back to the ER on 05/25/2021 with atypical chest pain that started when he smoked a cigarette.   He ruled out with negative enzymes and without recurrent chest pain and was discharged without admission.  Today, he shares that he has random chest pain that is not associated with any other symptoms. He states it did not feel like when he got his PCI. He denies any fluttering or palpitations, syncope or presyncope. He does not have any shortness of breath. He occasionally has some dizziness but just when bending over. He does have one bottle of water a day in addition to crystal light and Gatorade. We discussed incorporating more water into his daily routine. I asked him to also take his BP everyday and send me the values. We also discussed his EKG and it was suggestive for pericarditis which would aline with his symptoms.   Reports no shortness of  breath nor dyspnea on exertion. No edema, orthopnea, PND. Reports no palpitations.    Past Medical History:  Diagnosis Date   Allergy    Cancer [facility])    Prostate    Past Surgical History:  Procedure Laterality Date   LEFT HEART CATH AND CORONARY ANGIOGRAPHY N/A 05/09/2021   Procedure: LEFT HEART CATH AND CORONARY ANGIOGRAPHY;  Surgeon: Burnell Blanks, MD;  Location: [place] [facility];  Service: Cardiovascular;  Laterality: N/A;   ROBOT ASSISTED LAPAROSCOPIC RADICAL PROSTATECTOMY Bilateral 01/08/2017   Procedure: XI ROBOTIC ASSISTED LAPAROSCOPIC RADICAL PROSTATECTOMY WITH BILATERAL PELVIC  NODE  DISSECTON;  Surgeon: Ardis Hughs, MD;  Location: [facility];  Service: Urology;  Laterality: Bilateral;    Current Medications: Current Meds  Medication Sig   amLODipine (NORVASC) 5 MG tablet Take 5 mg by mouth daily.   aspirin 81 MG EC tablet Take 1 tablet (81 mg total) by mouth daily. Swallow whole.   atorvastatin (LIPITOR) 80 MG tablet Take 1 tablet (80 mg total) by mouth daily.   clopidogrel (PLAVIX) 75 MG tablet Take 1 tablet (75 mg total) by mouth daily.   metoprolol succinate (TOPROL XL) 50 MG 24 hr tablet Take 1 tablet (50 mg total) by mouth daily. Take with or immediately following a meal.   nitroGLYCERIN (NITROSTAT) 0.4 MG SL tablet Place 1 tablet (0.4 mg total) under the tongue every 5 (five) minutes as needed for chest pain.   omeprazole (PRILOSEC) 20 MG  capsule 1 capsule 30 minutes before morning meal   pantoprazole (PROTONIX) 40 MG tablet Take 1 tablet (40 mg total) by mouth daily.   triamcinolone ointment (KENALOG) 0.1 % Apply 1 application topically in the morning and at bedtime. Uses on legs     Allergies:   Wool alcohol [lanolin]   Social History   Socioeconomic History   Marital status: Single    Spouse name: Not on file   Number of children: Not on file   Years of education: Not on file   Highest education level: Not on file  Occupational History   Not  on file  Tobacco Use   Smoking status: Former    Packs/day: 1.00    Types: Cigarettes   Smokeless tobacco: Never  Substance and Sexual Activity   Alcohol use: Yes    Alcohol/week: 2.0 standard drinks    Types: 2 Cans of beer per week    Comment: Daily   Drug use: Yes    Types: Marijuana   Sexual activity: Not on file  Other Topics Concern   Not on file  Social History Narrative   Not on file   Social Determinants of Health   Financial Resource Strain: Not on file  Food Insecurity: Not on file  Transportation Needs: Not on file  Physical Activity: Not on file  Stress: Not on file  Social Connections: Not on file     Family History: The patient's family history includes Coronary artery disease in his mother and sister; Stroke in his father.  ROS:   Please see the history of present illness.     All other systems reviewed and are negative.  EKGs/Labs/Other Studies Reviewed:    The following studies were reviewed today:  Left Cardiac Catheterization 05/09/2021:   Mid RCA lesion is 20% stenosed.   Mid LAD lesion is 30% stenosed.   2nd Mrg lesion is 100% stenosed.   Mild non-obstructive, calcified mid LAD stenosis The Circumflex and ramus intermediate branch are patent. There is a very small second obtuse marginal branch (.75 mm) that appears to be occluded.  The large dominant RCA has mild mid vessel calcified stenosis.  Normal LV filling pressure   Recommendations: He has no obstructive disease in the major epicardial vessels. There is a very small caliber obtuse marginal branch that appears to be occluded. I have reviewed the cath films with the IC team and we are in agreement that there is no lesions that are amenable to PCI. Will continue ASA, statin and beta blocker. I will load with Plavix 300 mg today and then start 75 mg daily. I would continue DAPT for one year. Continue Norvasc. Check D-dimer.    Diagnostic Dominance: Right _______________   Echocardiogram  05/09/2021: Impressions:  1. Left ventricular ejection fraction, by estimation, is 60 to 65%. The  left ventricle has normal function. The left ventricle has no regional  wall motion abnormalities. Left ventricular diastolic parameters are  consistent with Grade I diastolic  dysfunction (impaired relaxation).   2. Right ventricular systolic function is normal. The right ventricular  size is normal.   3. The mitral valve is normal in structure. No evidence of mitral valve  regurgitation. No evidence of mitral stenosis.   4. The aortic valve is tricuspid. Aortic valve regurgitation is not  visualized. No aortic stenosis is present.   5. The inferior vena cava is normal in size with greater than 50%  respiratory variability, suggesting right atrial pressure of 3 mmHg.  _____________   Chest CTA 05/10/2021: Impression: 1. No evidence of pulmonary embolism. 2. No acute findings are noted in the thorax to account for the patient's symptoms. 3. Aortic atherosclerosis, in addition to left main and three-vessel coronary artery disease. Please note that although the presence of coronary artery calcium documents the presence of coronary artery disease, the severity of this disease and any potential stenosis cannot be assessed on this non-gated CT examination. Assessment for potential risk factor modification, dietary therapy or pharmacologic therapy may be warranted, if clinically indicated. _______________   Lower Extremity Dopplers 05/10/2021 (Preliminary Read): Summary:  RIGHT:  - There is no evidence of deep vein thrombosis in the lower extremity.  - No cystic structure found in the popliteal fossa.     LEFT:  - There is no evidence of deep vein thrombosis in the lower extremity.  - No cystic structure found in the popliteal fossa.   EKG:  EKG is  ordered today.  The ekg ordered today demonstrates NSR, rate 65 bpm.  Recent Labs: 05/25/2021: BUN 11; Creatinine, Ser 0.99; Hemoglobin  11.0; Platelets 340; Potassium 3.8; Sodium 138  Recent Lipid Panel    Component Value Date/Time   CHOL 153 05/10/2021 0139   TRIG 80 05/10/2021 0139   HDL 60 05/10/2021 0139   CHOLHDL 2.6 05/10/2021 0139   VLDL 16 05/10/2021 0139   LDLCALC 77 05/10/2021 0139         Physical Exam:    VS:  BP 110/66    Pulse 65    Ht 5\' 7"  (1.702 m)    Wt 122 lb (55.3 kg)    SpO2 97%    BMI 19.11 kg/m     Wt Readings from Last 3 Encounters:  06/07/21 122 lb (55.3 kg)  05/25/21 115 lb 1.3 oz (52.2 kg)  05/09/21 115 lb 1.3 oz (52.2 kg)     GEN:  Well nourished, well developed in no acute distress HEENT: Normal NECK: No JVD; No carotid bruits LYMPHATICS: No lymphadenopathy CARDIAC: RRR, no murmurs, rubs, gallops RESPIRATORY:  Clear to auscultation without rales, wheezing or rhonchi  ABDOMEN: Soft, non-tender, non-distended MUSCULOSKELETAL:  No edema; No deformity  SKIN: Warm and dry NEUROLOGIC:  Alert and oriented x 3 PSYCHIATRIC:  Normal affect   ASSESSMENT:    1. Coronary artery disease involving native coronary artery of native heart with unstable angina pectoris ([place])   2. Hypertension, unspecified type    PLAN:    In order of problems listed above:  CAD/NSTEMI 05/07/21 - 100% stenosis in a small OM2 - DAPT with ASA and plavix recommended for 1 year -No issues with bleeding on DAPT - continue BB and statin   2. Hyperlipidemia with LDL goal < 70 05/10/2021: Cholesterol 153; HDL 60; LDL Cholesterol 77; Triglycerides 80; VLDL 16 -Lipid panel ordered today - continue 80 mg lipitor   3. Hypertension - continue amlodipine -Please record you BP everyday and send me the readings -Was having symptoms of orthostatic hypotension, discuss fluid intake   4. Smoker - he quit "cold [place]"  5. Chest Discomfort -non-exertional -Pericarditis suggested on EKG -Will order sed rate and CRP -No other associated symptoms   Medication Adjustments/Labs and Tests Ordered: Current  medicines are reviewed at length with the patient today.  Concerns regarding medicines are outlined above.  Orders Placed This Encounter  Procedures   Lipid panel   Sedimentation rate   C-reactive protein   EKG 12-Lead   No orders of the defined  types were placed in this encounter.   Patient Instructions  Medication Instructions:  Your physician recommends that you continue on your current medications as directed. Please refer to the Current Medication list given to you today.   *If you need a refill on your cardiac medications before your next appointment, please call your pharmacy*   Lab Work: Your physician recommends that you return for lab work today [place] If you have labs (blood work) drawn today and your tests are completely normal, you will receive your results only by: [place] (if you have MyChart) OR A paper copy in the mail If you have any lab test that is abnormal or we need to change your treatment, we will call you to review the results.   Testing/Procedures: NONE ordered at this time of appointment     Follow-Up: At [facility], you and your health needs are our priority.  As part of our continuing mission to provide you with exceptional heart care, we have created designated Provider Care Teams.  These Care Teams include your primary Cardiologist (physician) and Advanced Practice Providers (APPs -  Physician Assistants and Nurse Practitioners) who all work together to provide you with the care you need, when you need it.  We recommend signing up for the patient portal called "MyChart".  Sign up information is provided on this After Visit Summary.  MyChart is used to connect with patients for Virtual Visits (Telemedicine).  Patients are able to view lab/test results, encounter notes, upcoming appointments, etc.  Non-urgent messages can be sent to your provider as well.   To learn more about what you can do with  MyChart, go to [email or web address].    Your next appointment:   2 month(s)  The format for your next appointment:   In Person  Provider:   APP        Other Instructions Please drink 64 oz of water as directed daily  Orthostatic Hypotension Blood pressure is a measurement of how strongly, or weakly, your circulating blood is pressing against the walls of your arteries. Orthostatic hypotension is a drop in blood pressure that can happen when you change positions, such as when you go from lying down to standing. Arteries are blood vessels that carry blood from your heart throughout your body. When blood pressure is too low, you may not get enough blood to your brain or to the rest of your organs. Orthostatic hypotension can cause light-headedness, sweating, rapid heartbeat, blurred vision, and fainting. These symptoms require further investigation into the cause. What are the causes? Orthostatic hypotension can be caused by many things, including: Sudden changes in posture, such as standing up quickly after you have been sitting or lying down. Loss of blood (anemia) or loss of body fluids (dehydration). Heart problems, neurologic problems, or hormone problems. Pregnancy. Aging. The risk for this condition increases as you get older. Severe infection (sepsis). Certain medicines, such as medicines for high blood pressure or medicines that make the body lose excess fluids (diuretics). What are the signs or symptoms? Symptoms of this condition may include: Weakness, light-headedness, or dizziness. Sweating. Blurred vision. Tiredness (fatigue). Rapid heartbeat. Fainting, in severe cases. How is this diagnosed? This condition is diagnosed based on: Your symptoms and medical history. Your blood pressure measurements. Your health care provider will check your blood pressure when you are: Lying down. Sitting. Standing. A blood pressure reading is recorded as two numbers, such as  "120 over 80" (or 120/80). The  first ("top") number is called the systolic pressure. It is a measure of the pressure in your arteries as your heart beats. The second ("bottom") number is called the diastolic pressure. It is a measure of the pressure in your arteries when your heart relaxes between beats. Blood pressure is measured in a unit called mmHg. Healthy blood pressure for most adults is 120/80 mmHg. Orthostatic hypotension is defined as a 20 mmHg drop in systolic pressure or a 10 mmHg drop in diastolic pressure within 3 minutes of standing. Other information or tests that may be used to diagnose orthostatic hypotension include: Your other vital signs, such as your heart rate and temperature. Blood tests. An electrocardiogram (ECG) or echocardiogram. A Holter monitor. This is a device you wear that records your heart rhythm continuously, usually for 24-48 hours. Tilt table test. For this test, you will be safely secured to a table that moves you from a lying position to an upright position. Your heart rhythm and blood pressure will be monitored during the test. How is this treated? This condition may be treated by: Changing your diet. This may involve eating more salt (sodium) or drinking more water. Changing the dosage of certain medicines you are taking that might be lowering your blood pressure. Correcting the underlying reason for the orthostatic hypotension. Wearing compression stockings. Taking medicines to raise your blood pressure. Avoiding actions that trigger symptoms. Follow these instructions at home: Medicines Take over-the-counter and prescription medicines only as told by your health care provider. Follow instructions from your health care provider about changing the dosage of your current medicines, if this applies. Do not stop or adjust any of your medicines on your own. Eating and drinking  Drink enough fluid to keep your urine pale yellow. Eat extra salt only as  directed. Do not add extra salt to your diet unless advised by your health care provider. Eat frequent, small meals. Avoid standing up suddenly after eating. General instructions  Get up slowly from lying down or sitting positions. This gives your blood pressure a chance to adjust. Avoid hot showers and excessive heat as directed by your health care provider. Engage in regular physical activity as directed by your health care provider. If you have compression stockings, wear them as told. Keep all follow-up visits. This is important. Contact a health care provider if: You have a fever for more than 2-3 days. You feel more thirsty than usual. You feel dizzy or weak. Get help right away if: You have chest pain. You have a fast or irregular heartbeat. You become sweaty or feel light-headed. You feel short of breath. You faint. You have any symptoms of a stroke. "BE FAST" is an easy way to remember the main warning signs of a stroke: B - Balance. Signs are dizziness, sudden trouble walking, or loss of balance. E - Eyes. Signs are trouble seeing or a sudden change in vision. F - Face. Signs are sudden weakness or numbness of the face, or the face or eyelid drooping on one side. A - Arms. Signs are weakness or numbness in an arm. This happens suddenly and usually on one side of the body. S - Speech. Signs are sudden trouble speaking, slurred speech, or trouble understanding what people say. T - Time. Time to call emergency services. Write down what time symptoms started. You have other signs of a stroke, such as: A sudden, severe headache with no known cause. Nausea or vomiting. Seizure. These symptoms may represent a serious problem  that is an emergency. Do not wait to see if the symptoms will go away. Get medical help right away. Call your local emergency services (911 in the [place] Do not drive yourself to the hospital. Summary Orthostatic hypotension is a sudden drop in blood  pressure. It can cause light-headedness, sweating, rapid heartbeat, blurred vision, and fainting. Orthostatic hypotension can be diagnosed by having your blood pressure taken while lying down, sitting, and then standing. Treatment may involve changing your diet, wearing compression stockings, sitting up slowly, adjusting your medicines, or correcting the underlying reason for the orthostatic hypotension. Get help right away if you have chest pain, a fast or irregular heartbeat, or symptoms of a stroke. This information is not intended to replace advice given to you by your health care provider. Make sure you discuss any questions you have with your health care provider. Document Revised: 07/13/2020 Document Reviewed: 07/13/2020 Elsevier Patient Education  2022 [place], Elgie Collard, [place]  06/07/2021 4:57 PM    [facility]

## 2021-06-07 ENCOUNTER — Encounter: Payer: Self-pay | Admitting: Physician Assistant

## 2021-06-07 ENCOUNTER — Ambulatory Visit: Payer: 59 | Admitting: Physician Assistant

## 2021-06-07 ENCOUNTER — Other Ambulatory Visit: Payer: Self-pay

## 2021-06-07 VITALS — BP 110/66 | HR 65 | Ht 67.0 in | Wt 122.0 lb

## 2021-06-07 DIAGNOSIS — E785 Hyperlipidemia, unspecified: Secondary | ICD-10-CM | POA: Diagnosis not present

## 2021-06-07 DIAGNOSIS — I10 Essential (primary) hypertension: Secondary | ICD-10-CM | POA: Diagnosis not present

## 2021-06-07 DIAGNOSIS — I25110 Atherosclerotic heart disease of native coronary artery with unstable angina pectoris: Secondary | ICD-10-CM | POA: Diagnosis not present

## 2021-06-07 DIAGNOSIS — Z720 Tobacco use: Secondary | ICD-10-CM | POA: Diagnosis not present

## 2021-06-07 DIAGNOSIS — R0789 Other chest pain: Secondary | ICD-10-CM

## 2021-06-07 NOTE — Patient Instructions (Addendum)
Medication Instructions:  Your physician recommends that you continue on your current medications as directed. Please refer to the Current Medication list given to you today.   *If you need a refill on your cardiac medications before your next appointment, please call your pharmacy*   Lab Work: Your physician recommends that you return for lab work today [facility] If you have labs (blood work) drawn today and your tests are completely normal, you will receive your results only by: [place] (if you have MyChart) OR A paper copy in the mail If you have any lab test that is abnormal or we need to change your treatment, we will call you to review the results.   Testing/Procedures: NONE ordered at this time of appointment     Follow-Up: At [facility], you and your health needs are our priority.  As part of our continuing mission to provide you with exceptional heart care, we have created designated Provider Care Teams.  These Care Teams include your primary Cardiologist (physician) and Advanced Practice Providers (APPs -  Physician Assistants and Nurse Practitioners) who all work together to provide you with the care you need, when you need it.  We recommend signing up for the patient portal called "MyChart".  Sign up information is provided on this After Visit Summary.  MyChart is used to connect with patients for Virtual Visits (Telemedicine).  Patients are able to view lab/test results, encounter notes, upcoming appointments, etc.  Non-urgent messages can be sent to your provider as well.   To learn more about what you can do with MyChart, go to [email or web address].    Your next appointment:   2 month(s)  The format for your next appointment:   In Person  Provider:   APP        Other Instructions Please drink 64 oz of water as directed daily  Orthostatic Hypotension Blood pressure is a measurement of how strongly, or weakly,  your circulating blood is pressing against the walls of your arteries. Orthostatic hypotension is a drop in blood pressure that can happen when you change positions, such as when you go from lying down to standing. Arteries are blood vessels that carry blood from your heart throughout your body. When blood pressure is too low, you may not get enough blood to your brain or to the rest of your organs. Orthostatic hypotension can cause light-headedness, sweating, rapid heartbeat, blurred vision, and fainting. These symptoms require further investigation into the cause. What are the causes? Orthostatic hypotension can be caused by many things, including: Sudden changes in posture, such as standing up quickly after you have been sitting or lying down. Loss of blood (anemia) or loss of body fluids (dehydration). Heart problems, neurologic problems, or hormone problems. Pregnancy. Aging. The risk for this condition increases as you get older. Severe infection (sepsis). Certain medicines, such as medicines for high blood pressure or medicines that make the body lose excess fluids (diuretics). What are the signs or symptoms? Symptoms of this condition may include: Weakness, light-headedness, or dizziness. Sweating. Blurred vision. Tiredness (fatigue). Rapid heartbeat. Fainting, in severe cases. How is this diagnosed? This condition is diagnosed based on: Your symptoms and medical history. Your blood pressure measurements. Your health care provider will check your blood pressure when you are: Lying down. Sitting. Standing. A blood pressure reading is recorded as two numbers, such as "120 over 80" (or 120/80). The first ("top") number is called the systolic pressure. It is a measure  of the pressure in your arteries as your heart beats. The second ("bottom") number is called the diastolic pressure. It is a measure of the pressure in your arteries when your heart relaxes between beats. Blood pressure is  measured in a unit called mmHg. Healthy blood pressure for most adults is 120/80 mmHg. Orthostatic hypotension is defined as a 20 mmHg drop in systolic pressure or a 10 mmHg drop in diastolic pressure within 3 minutes of standing. Other information or tests that may be used to diagnose orthostatic hypotension include: Your other vital signs, such as your heart rate and temperature. Blood tests. An electrocardiogram (ECG) or echocardiogram. A Holter monitor. This is a device you wear that records your heart rhythm continuously, usually for 24-48 hours. Tilt table test. For this test, you will be safely secured to a table that moves you from a lying position to an upright position. Your heart rhythm and blood pressure will be monitored during the test. How is this treated? This condition may be treated by: Changing your diet. This may involve eating more salt (sodium) or drinking more water. Changing the dosage of certain medicines you are taking that might be lowering your blood pressure. Correcting the underlying reason for the orthostatic hypotension. Wearing compression stockings. Taking medicines to raise your blood pressure. Avoiding actions that trigger symptoms. Follow these instructions at home: Medicines Take over-the-counter and prescription medicines only as told by your health care provider. Follow instructions from your health care provider about changing the dosage of your current medicines, if this applies. Do not stop or adjust any of your medicines on your own. Eating and drinking  Drink enough fluid to keep your urine pale yellow. Eat extra salt only as directed. Do not add extra salt to your diet unless advised by your health care provider. Eat frequent, small meals. Avoid standing up suddenly after eating. General instructions  Get up slowly from lying down or sitting positions. This gives your blood pressure a chance to adjust. Avoid hot showers and excessive heat as  directed by your health care provider. Engage in regular physical activity as directed by your health care provider. If you have compression stockings, wear them as told. Keep all follow-up visits. This is important. Contact a health care provider if: You have a fever for more than 2-3 days. You feel more thirsty than usual. You feel dizzy or weak. Get help right away if: You have chest pain. You have a fast or irregular heartbeat. You become sweaty or feel light-headed. You feel short of breath. You faint. You have any symptoms of a stroke. "BE FAST" is an easy way to remember the main warning signs of a stroke: B - Balance. Signs are dizziness, sudden trouble walking, or loss of balance. E - Eyes. Signs are trouble seeing or a sudden change in vision. F - Face. Signs are sudden weakness or numbness of the face, or the face or eyelid drooping on one side. A - Arms. Signs are weakness or numbness in an arm. This happens suddenly and usually on one side of the body. S - Speech. Signs are sudden trouble speaking, slurred speech, or trouble understanding what people say. T - Time. Time to call emergency services. Write down what time symptoms started. You have other signs of a stroke, such as: A sudden, severe headache with no known cause. Nausea or vomiting. Seizure. These symptoms may represent a serious problem that is an emergency. Do not wait to see if the  symptoms will go away. Get medical help right away. Call your local emergency services (911 in the [place] Do not drive yourself to the hospital. Summary Orthostatic hypotension is a sudden drop in blood pressure. It can cause light-headedness, sweating, rapid heartbeat, blurred vision, and fainting. Orthostatic hypotension can be diagnosed by having your blood pressure taken while lying down, sitting, and then standing. Treatment may involve changing your diet, wearing compression stockings, sitting up slowly, adjusting your  medicines, or correcting the underlying reason for the orthostatic hypotension. Get help right away if you have chest pain, a fast or irregular heartbeat, or symptoms of a stroke. This information is not intended to replace advice given to you by your health care provider. Make sure you discuss any questions you have with your health care provider. Document Revised: 07/13/2020 Document Reviewed: 07/13/2020 Elsevier Patient Education  [place].

## 2021-06-08 ENCOUNTER — Other Ambulatory Visit: Payer: Self-pay

## 2021-06-08 ENCOUNTER — Observation Stay (HOSPITAL_COMMUNITY)
Admission: EM | Admit: 2021-06-08 | Discharge: 2021-06-09 | Disposition: A | Discharge status: Home / Self Care | Payer: 59 | Attending: Interventional Cardiology | Admitting: Interventional Cardiology

## 2021-06-08 ENCOUNTER — Encounter (HOSPITAL_COMMUNITY): Payer: Self-pay | Admitting: Physician Assistant

## 2021-06-08 ENCOUNTER — Emergency Department (HOSPITAL_COMMUNITY): Payer: 59

## 2021-06-08 DIAGNOSIS — Z7982 Long term (current) use of aspirin: Secondary | ICD-10-CM | POA: Insufficient documentation

## 2021-06-08 DIAGNOSIS — R7989 Other specified abnormal findings of blood chemistry: Secondary | ICD-10-CM | POA: Diagnosis not present

## 2021-06-08 DIAGNOSIS — Z79899 Other long term (current) drug therapy: Secondary | ICD-10-CM | POA: Insufficient documentation

## 2021-06-08 DIAGNOSIS — I208 Other forms of angina pectoris: Secondary | ICD-10-CM

## 2021-06-08 DIAGNOSIS — R002 Palpitations: Secondary | ICD-10-CM | POA: Diagnosis not present

## 2021-06-08 DIAGNOSIS — Z20822 Contact with and (suspected) exposure to covid-19: Secondary | ICD-10-CM | POA: Diagnosis not present

## 2021-06-08 DIAGNOSIS — J449 Chronic obstructive pulmonary disease, unspecified: Secondary | ICD-10-CM | POA: Insufficient documentation

## 2021-06-08 DIAGNOSIS — I2510 Atherosclerotic heart disease of native coronary artery without angina pectoris: Secondary | ICD-10-CM | POA: Diagnosis present

## 2021-06-08 DIAGNOSIS — Y990 Civilian activity done for income or pay: Secondary | ICD-10-CM | POA: Diagnosis not present

## 2021-06-08 DIAGNOSIS — Z720 Tobacco use: Secondary | ICD-10-CM | POA: Diagnosis present

## 2021-06-08 DIAGNOSIS — Z87891 Personal history of nicotine dependence: Secondary | ICD-10-CM | POA: Diagnosis not present

## 2021-06-08 DIAGNOSIS — D500 Iron deficiency anemia secondary to blood loss (chronic): Secondary | ICD-10-CM

## 2021-06-08 DIAGNOSIS — Z8546 Personal history of malignant neoplasm of prostate: Secondary | ICD-10-CM | POA: Diagnosis not present

## 2021-06-08 DIAGNOSIS — I10 Essential (primary) hypertension: Secondary | ICD-10-CM | POA: Diagnosis present

## 2021-06-08 DIAGNOSIS — R0789 Other chest pain: Principal | ICD-10-CM | POA: Insufficient documentation

## 2021-06-08 DIAGNOSIS — D649 Anemia, unspecified: Secondary | ICD-10-CM | POA: Diagnosis not present

## 2021-06-08 DIAGNOSIS — R079 Chest pain, unspecified: Secondary | ICD-10-CM | POA: Diagnosis present

## 2021-06-08 DIAGNOSIS — T59811A Toxic effect of smoke, accidental (unintentional), initial encounter: Secondary | ICD-10-CM | POA: Insufficient documentation

## 2021-06-08 DIAGNOSIS — Z7902 Long term (current) use of antithrombotics/antiplatelets: Secondary | ICD-10-CM | POA: Insufficient documentation

## 2021-06-08 DIAGNOSIS — E785 Hyperlipidemia, unspecified: Secondary | ICD-10-CM | POA: Diagnosis present

## 2021-06-08 DIAGNOSIS — J705 Respiratory conditions due to smoke inhalation: Secondary | ICD-10-CM | POA: Diagnosis present

## 2021-06-08 HISTORY — DX: Atherosclerosis of aorta: I70.0

## 2021-06-08 HISTORY — DX: Malignant neoplasm of prostate: C61

## 2021-06-08 HISTORY — DX: Essential (primary) hypertension: I10

## 2021-06-08 HISTORY — DX: Tobacco use: Z72.0

## 2021-06-08 HISTORY — DX: Hyperlipidemia, unspecified: E78.5

## 2021-06-08 HISTORY — DX: Chronic obstructive pulmonary disease, unspecified: J44.9

## 2021-06-08 HISTORY — DX: Atherosclerotic heart disease of native coronary artery without angina pectoris: I25.10

## 2021-06-08 LAB — BASIC METABOLIC PANEL
Anion gap: 6 (ref 5–15)
BUN: 20 mg/dL (ref 8–23)
CO2: 22 mmol/L (ref 22–32)
Calcium: 8.2 mg/dL — ABNORMAL LOW (ref 8.9–10.3)
Chloride: 109 mmol/L (ref 98–111)
Creatinine, Ser: 0.92 mg/dL (ref 0.61–1.24)
GFR, Estimated: >60 mL/min (ref >60)
Glucose, Bld: 96 mg/dL (ref 70–99)
Potassium: 4.4 mmol/L (ref 3.5–5.1)
Sodium: 137 mmol/L (ref 135–145)

## 2021-06-08 LAB — CBC
HCT: 29.5 % — ABNORMAL LOW (ref 39.0–52.0)
Hemoglobin: 9.4 g/dL — ABNORMAL LOW (ref 13.0–17.0)
MCH: 29.5 pg (ref 26.0–34.0)
MCHC: 31.9 g/dL (ref 30.0–36.0)
MCV: 92.5 fL (ref 80.0–100.0)
Platelets: 216 10*3/uL (ref 150–400)
RBC: 3.19 MIL/uL — ABNORMAL LOW (ref 4.22–5.81)
RDW: 12.6 % (ref 11.5–15.5)
WBC: 4.1 10*3/uL (ref 4.0–10.5)
nRBC: 0 % (ref 0.0–0.2)

## 2021-06-08 LAB — HEPATIC FUNCTION PANEL
ALT: 142 U/L — ABNORMAL HIGH (ref 0–44)
AST: 124 U/L — ABNORMAL HIGH (ref 15–41)
Albumin: 3.8 g/dL (ref 3.5–5.0)
Alkaline Phosphatase: 81 U/L (ref 38–126)
Bilirubin, Direct: <0.1 mg/dL (ref 0.0–0.2)
Total Bilirubin: 0.6 mg/dL (ref 0.3–1.2)
Total Protein: 6.8 g/dL (ref 6.5–8.1)

## 2021-06-08 LAB — IRON AND TIBC
Iron: 51 ug/dL (ref 45–182)
Saturation Ratios: 16 % — ABNORMAL LOW (ref 17.9–39.5)
TIBC: 311 ug/dL (ref 250–450)
UIBC: 260 ug/dL

## 2021-06-08 LAB — LIPID PANEL
Chol/HDL Ratio: 2.8 ratio (ref 0.0–5.0)
Cholesterol, Total: 132 mg/dL (ref 100–199)
HDL: 47 mg/dL (ref >39)
LDL Chol Calc (NIH): 73 mg/dL (ref 0–99)
Triglycerides: 56 mg/dL (ref 0–149)
VLDL Cholesterol Cal: 12 mg/dL (ref 5–40)

## 2021-06-08 LAB — RESP PANEL BY RT-PCR (FLU A&B, COVID) ARPGX2
Influenza A by PCR: NEGATIVE
Influenza B by PCR: NEGATIVE
SARS Coronavirus 2 by RT PCR: NEGATIVE

## 2021-06-08 LAB — RETICULOCYTES
Immature Retic Fract: 5.6 % (ref 2.3–15.9)
RBC.: 3.7 MIL/uL — ABNORMAL LOW (ref 4.22–5.81)
Retic Count, Absolute: 24 10*3/uL (ref 19.0–186.0)
Retic Ct Pct: 0.7 % (ref 0.4–3.1)

## 2021-06-08 LAB — RAPID URINE DRUG SCREEN, HOSP PERFORMED
Amphetamines: NOT DETECTED
Barbiturates: NOT DETECTED
Benzodiazepines: NOT DETECTED
Cocaine: NOT DETECTED
Opiates: NOT DETECTED
Tetrahydrocannabinol: NOT DETECTED

## 2021-06-08 LAB — TROPONIN I (HIGH SENSITIVITY)
Troponin I (High Sensitivity): 12 ng/L (ref <18)
Troponin I (High Sensitivity): 15 ng/L (ref <18)

## 2021-06-08 LAB — C-REACTIVE PROTEIN: CRP: 3 mg/L (ref 0–10)

## 2021-06-08 LAB — FOLATE: Folate: 11.2 ng/mL (ref >5.9)

## 2021-06-08 LAB — FERRITIN: Ferritin: 392 ng/mL — ABNORMAL HIGH (ref 24–336)

## 2021-06-08 LAB — TSH: TSH: 1.16 u[IU]/mL (ref 0.350–4.500)

## 2021-06-08 LAB — VITAMIN B12: Vitamin B-12: 381 pg/mL (ref 180–914)

## 2021-06-08 LAB — SEDIMENTATION RATE: Sed Rate: 34 mm/hr — ABNORMAL HIGH (ref 0–30)

## 2021-06-08 MED ORDER — PANTOPRAZOLE SODIUM 40 MG PO TBEC
40.0000 mg | DELAYED_RELEASE_TABLET | Freq: Every day | ORAL | Status: DC
  Administered 2021-06-09: 40 mg via ORAL
  Filled 2021-06-08: qty 1

## 2021-06-08 MED ORDER — ISOSORBIDE MONONITRATE ER 30 MG PO TB24
30.0000 mg | ORAL_TABLET | Freq: Every day | ORAL | Status: DC
  Administered 2021-06-08 – 2021-06-09 (×2): 30 mg via ORAL
  Filled 2021-06-08 (×2): qty 1

## 2021-06-08 MED ORDER — ATORVASTATIN CALCIUM 80 MG PO TABS
80.0000 mg | ORAL_TABLET | Freq: Every day | ORAL | Status: DC
Start: 2021-06-09 — End: 2021-06-09
  Administered 2021-06-09: 80 mg via ORAL
  Filled 2021-06-08: qty 1

## 2021-06-08 MED ORDER — CLOPIDOGREL BISULFATE 75 MG PO TABS
75.0000 mg | ORAL_TABLET | Freq: Every day | ORAL | Status: DC
  Administered 2021-06-09: 75 mg via ORAL
  Filled 2021-06-08: qty 1

## 2021-06-08 MED ORDER — ONDANSETRON HCL 4 MG/2ML IJ SOLN: 4.0000 mg | Freq: Four times a day (QID) | INTRAMUSCULAR | Status: DC | PRN

## 2021-06-08 MED ORDER — ACETAMINOPHEN 325 MG PO TABS
650.0000 mg | ORAL_TABLET | ORAL | Status: DC | PRN
  Administered 2021-06-09 (×2): 650 mg via ORAL
  Filled 2021-06-08 (×2): qty 2

## 2021-06-08 MED ORDER — TRIAMCINOLONE ACETONIDE 0.1 % EX OINT: 1.0000 "application " | TOPICAL_OINTMENT | Freq: Two times a day (BID) | CUTANEOUS | Status: DC

## 2021-06-08 MED ORDER — ASPIRIN EC 81 MG PO TBEC
81.0000 mg | DELAYED_RELEASE_TABLET | Freq: Every day | ORAL | Status: DC
  Administered 2021-06-09: 81 mg via ORAL
  Filled 2021-06-08: qty 1

## 2021-06-08 MED ORDER — NITROGLYCERIN 0.4 MG SL SUBL: 0.4000 mg | SUBLINGUAL_TABLET | SUBLINGUAL | Status: DC | PRN

## 2021-06-08 MED ORDER — SODIUM CHLORIDE 0.9% FLUSH
3.0000 mL | Freq: Two times a day (BID) | INTRAVENOUS | Status: DC
  Administered 2021-06-08 – 2021-06-09 (×2): 3 mL via INTRAVENOUS

## 2021-06-08 MED ORDER — METOPROLOL SUCCINATE ER 50 MG PO TB24
50.0000 mg | ORAL_TABLET | Freq: Every day | ORAL | Status: DC
  Administered 2021-06-09: 50 mg via ORAL
  Filled 2021-06-08: qty 1

## 2021-06-08 MED ORDER — SODIUM CHLORIDE 0.9% FLUSH: 3.0000 mL | INTRAVENOUS | Status: DC | PRN

## 2021-06-08 MED ORDER — ENOXAPARIN SODIUM 40 MG/0.4ML IJ SOSY
40.0000 mg | PREFILLED_SYRINGE | INTRAMUSCULAR | Status: DC
  Administered 2021-06-08: 40 mg via SUBCUTANEOUS
  Filled 2021-06-08: qty 0.4

## 2021-06-08 MED ORDER — SODIUM CHLORIDE 0.9 % IV SOLN: 250.0000 mL | INTRAVENOUS | Status: DC | PRN

## 2021-06-08 MED ORDER — AMLODIPINE BESYLATE 5 MG PO TABS
5.0000 mg | ORAL_TABLET | Freq: Every day | ORAL | Status: DC
  Administered 2021-06-09: 5 mg via ORAL
  Filled 2021-06-08: qty 1

## 2021-06-08 NOTE — ED Notes (Signed)
Attempted to call 6E with no answer
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
## 2021-06-08 NOTE — Progress Notes (Signed)
Pt was admitted to floor around 2020. Upon putting him on cardiac monitor - the rhythm was showing some ST elevation. Mindy, RN from [facility] was on the floor and looked through his chart. She saw that his EKG in the ED showed this ST elevation and cardiology is aware. He is currently not having any chest pain so she said there is not need for a repeat EKG at this time. Will continue to monitor pt.

## 2021-06-08 NOTE — ED Provider Notes (Signed)
[facility] Provider Note   CSN: [id number] Arrival date & time: 06/08/21  1438     History  Chief Complaint  Patient presents with   Chest Pain    Austin Hawkins is a 61 y.o. male.  HPI  HPI: A 61 year old patient with a history of hypertension and hypercholesterolemia presents for evaluation of chest pain. Initial onset of pain was approximately 3-6 hours ago. The patient's chest pain is described as heaviness/pressure/tightness, is not worse with exertion and is relieved by nitroglycerin. The patient's chest pain is middle- or left-sided, is not well-localized, is not sharp and does not radiate to the arms/jaw/neck. The patient does not complain of nausea and denies diaphoresis. The patient has smoked in the past 90 days. The patient has no history of stroke, has no history of peripheral artery disease, denies any history of treated diabetes, has no relevant family history of coronary artery disease (first degree relative at less than age 39) and does not have an elevated BMI (>=30).   61 year old male with a history of hypertension, hyperlipidemia, former tobacco abuse, prostate cancer, CAD with cardiac catheterization 05/09/2021 showing 100% stenosis of a very small OM not amenable to PCI and otherwise mild nonobstructive disease recommended aspirin and Plavix for 1 year, emergency department visit on January 13 with chest pain, followed by Cardiology with a visit yesterday who presents with chest pain.  Yesterday was concern for possible pericarditis, he had CRP and ESR ordered by cardiology which shows CRP within normal limits and CT and ESR mildly elevated at 34.  Today was working around smoke, think it triggered an episode Chest pain early in the morning from the smoke, went out, got some air, took a nitro, eased the pain and felt better. Went to lunch 1215, began working and about 30 minutes later chest pain came back, felt lightheaded,  frightened, was not feeling normal, heart fluttering some, felt like about to sweat but didn't, took another nitro and it eased off again, each episode lasted about 30 minutes  No associated shortness of breath or nausea Not exertional, not pleuritic  Took 4 baby aspirin at job  Currently chest pain free   Past Medical History:  Diagnosis Date   Allergy    Aortic atherosclerosis ([place])    CAD (coronary artery disease)    Cancer ([place])    Prostate   COPD (chronic obstructive pulmonary disease) ([place])    not yet formally diagnosed but seen on CXR 04/2021   Essential hypertension    Hyperlipidemia    Prostate cancer ([place])    Tobacco use      Home Medications Prior to Admission medications   Medication Sig Start Date End Date Taking? Authorizing Provider  amLODipine (NORVASC) 5 MG tablet Take 5 mg by mouth daily.   Yes [provider]  aspirin 81 MG EC tablet Take 1 tablet (81 mg total) by mouth daily. Swallow whole. 05/11/21  Yes Sande Rives E, PA-C  atorvastatin (LIPITOR) 80 MG tablet Take 1 tablet (80 mg total) by mouth daily. 05/11/21  Yes Sande Rives E, PA-C  clopidogrel (PLAVIX) 75 MG tablet Take 1 tablet (75 mg total) by mouth daily. 05/11/21  Yes Sande Rives E, PA-C  metoprolol succinate (TOPROL XL) 50 MG 24 hr tablet Take 1 tablet (50 mg total) by mouth daily. Take with or immediately following a meal. 05/10/21 05/10/22 Yes Goodrich, Callie E, PA-C  nitroGLYCERIN (NITROSTAT) 0.4 MG SL tablet Place 1 tablet (0.4  mg total) under the tongue every 5 (five) minutes as needed for chest pain. 05/10/21 05/10/22 Yes Goodrich, Callie E, PA-C  pantoprazole (PROTONIX) 40 MG tablet Take 1 tablet (40 mg total) by mouth daily. 05/10/21 08/08/21 Yes Sande Rives E, PA-C  triamcinolone ointment (KENALOG) 0.1 % Apply 1 application topically in the morning and at bedtime. Uses on legs 04/24/21  Yes [provider]      Allergies    Wool alcohol [lanolin]     Review of Systems   Review of Systems See above  Physical Exam Updated Vital Signs BP 117/65 (BP Location: Left Arm)    Pulse 69    Temp 98.4 F (36.9 C) (Oral)    Resp 18    Ht 5' 7"  (1.702 m)    Wt 55 kg    SpO2 100%    BMI 18.99 kg/m  Physical Exam Vitals and nursing note reviewed.  Constitutional:      General: He is not in acute distress.    Appearance: He is well-developed. He is not diaphoretic.  HENT:     Head: Normocephalic and atraumatic.  Eyes:     Conjunctiva/sclera: Conjunctivae normal.  Cardiovascular:     Rate and Rhythm: Normal rate and regular rhythm.     Heart sounds: Normal heart sounds. No murmur heard.   No friction rub. No gallop.  Pulmonary:     Effort: Pulmonary effort is normal. No respiratory distress.     Breath sounds: Normal breath sounds. No wheezing or rales.  Abdominal:     General: There is no distension.     Palpations: Abdomen is soft.     Tenderness: There is no abdominal tenderness. There is no guarding.  Musculoskeletal:     Cervical back: Normal range of motion.  Skin:    General: Skin is warm and dry.  Neurological:     Mental Status: He is alert and oriented to person, place, and time.    ED Results / Procedures / Treatments   Labs (all labs ordered are listed, but only abnormal results are displayed) Labs Reviewed  BASIC METABOLIC PANEL - Abnormal; Notable for the following components:      Result Value   Calcium 8.2 (*)    All other components within normal limits  CBC - Abnormal; Notable for the following components:   RBC 3.19 (*)    Hemoglobin 9.4 (*)    HCT 29.5 (*)    All other components within normal limits  HEPATIC FUNCTION PANEL - Abnormal; Notable for the following components:   AST 124 (*)    ALT 142 (*)    All other components within normal limits  IRON AND TIBC - Abnormal; Notable for the following components:   Saturation Ratios 16 (*)    All other components within normal limits  FERRITIN -  Abnormal; Notable for the following components:   Ferritin 392 (*)    All other components within normal limits  RETICULOCYTES - Abnormal; Notable for the following components:   RBC. 3.70 (*)    All other components within normal limits  CBC - Abnormal; Notable for the following components:   RBC 3.42 (*)    Hemoglobin 9.8 (*)    HCT 30.5 (*)    All other components within normal limits  RESP PANEL BY RT-PCR (FLU A&B, COVID) ARPGX2  TSH  RAPID URINE DRUG SCREEN, HOSP PERFORMED  VITAMIN B12  FOLATE  HIV ANTIBODY (ROUTINE TESTING W REFLEX)  PROTIME-INR  TROPONIN I (HIGH SENSITIVITY)  TROPONIN I (HIGH SENSITIVITY)    EKG EKG Interpretation  Date/Time:  Friday June 08 2021 14:46:59 EST Ventricular Rate:  65 PR Interval:  179 QRS Duration: 85 QT Interval:  369 QTC Calculation: 384 R Axis:   34 Text Interpretation: Sinus rhythm Consider left atrial enlargement Borderline low voltage, extremity leads RSR' in V1 or V2, probably normal variant Borderline ST elevation, lateral leads similar to prior tracing  earlier this month Confirmed by Wynona Dove ([id number]) on 06/08/2021 2:50:45 PM  Radiology DG Chest 2 View  Result Date: 06/08/2021 CLINICAL DATA:  Chest pain EXAM: CHEST - 2 VIEW COMPARISON:  May 25, 2021 FINDINGS: The heart size and mediastinal contours are within normal limits. Nodularity projecting over the bilateral lung bases likely reflect nipple shadows. No focal airspace consolidation. No pleural effusion or pneumothorax. The visualized skeletal structures are unchanged. IMPRESSION: No active cardiopulmonary disease. Nodularity projecting over the bilateral lung bases likely reflect nipple shadows. Electronically Signed   By: Dahlia Bailiff M.D.   On: 06/08/2021 15:20    Procedures Procedures    Medications Ordered in ED Medications  amLODipine (NORVASC) tablet 5 mg (5 mg Oral Given 06/09/21 0918)  atorvastatin (LIPITOR) tablet 80 mg (80 mg Oral Given 06/09/21 0918)   metoprolol succinate (TOPROL-XL) 24 hr tablet 50 mg (50 mg Oral Given 06/09/21 0918)  pantoprazole (PROTONIX) EC tablet 40 mg (40 mg Oral Given 06/09/21 0918)  clopidogrel (PLAVIX) tablet 75 mg (75 mg Oral Given 06/09/21 0918)  isosorbide mononitrate (IMDUR) 24 hr tablet 30 mg (30 mg Oral Given 06/09/21 0918)  nitroGLYCERIN (NITROSTAT) SL tablet 0.4 mg (has no administration in time range)  acetaminophen (TYLENOL) tablet 650 mg (650 mg Oral Given 06/09/21 0232)  ondansetron (ZOFRAN) injection 4 mg (has no administration in time range)  enoxaparin (LOVENOX) injection 40 mg (40 mg Subcutaneous Given 06/08/21 1956)  sodium chloride flush (NS) 0.9 % injection 3 mL (3 mLs Intravenous Given 06/09/21 0919)  sodium chloride flush (NS) 0.9 % injection 3 mL (has no administration in time range)  0.9 %  sodium chloride infusion (has no administration in time range)    ED Course/ Medical Decision Making/ A&P   HEAR Score: 5                       Medical Decision Making Amount and/or Complexity of Data Reviewed Labs: ordered. Radiology: ordered.  Risk Decision regarding hospitalization.    61 year old male with a history of hypertension, hyperlipidemia, former tobacco abuse, prostate cancer, CAD with cardiac catheterization 05/09/2021 showing 100% stenosis of a very small OM not amenable to PCI and otherwise mild nonobstructive disease recommended aspirin and Plavix for 1 year, emergency department visit on January 13 with chest pain, followed by Cardiology with a visit yesterday who presents with chest pain.  Differential diagnosis for chest pain includes pulmonary embolus, dissection, pneumothorax, pneumonia, ACS, myocarditis, pericarditis.  EKG was done and evaluate by me and showed similar STE to prior, new TW inversion inferiorly. Chest x-ray was done and evaluated by me and radiology and showed no sign of pneumonia or pneumothorax. . Do not feel history or exam are consistent with aortic dissection.   Low suspicion for PE given no continuing symptoms, no dyspnea, no sign of DVT.   Symptoms may represent arrhythmia, continued ischemic symptoms from small OM stenosis or other.  Cardiology consulted for further evaluation and will admit for further care.  Final Clinical Impression(s) / ED Diagnoses Final diagnoses:  Chest pain, unspecified type  Palpitations    Rx / DC Orders ED Discharge Orders     None         Gareth Morgan, MD 06/09/21 1203

## 2021-06-09 DIAGNOSIS — J705 Respiratory conditions due to smoke inhalation: Secondary | ICD-10-CM | POA: Diagnosis present

## 2021-06-09 DIAGNOSIS — I25118 Atherosclerotic heart disease of native coronary artery with other forms of angina pectoris: Secondary | ICD-10-CM

## 2021-06-09 DIAGNOSIS — D649 Anemia, unspecified: Secondary | ICD-10-CM

## 2021-06-09 DIAGNOSIS — R7989 Other specified abnormal findings of blood chemistry: Secondary | ICD-10-CM

## 2021-06-09 DIAGNOSIS — R0603 Acute respiratory distress: Secondary | ICD-10-CM | POA: Diagnosis not present

## 2021-06-09 DIAGNOSIS — I208 Other forms of angina pectoris: Secondary | ICD-10-CM | POA: Diagnosis not present

## 2021-06-09 LAB — CBC
HCT: 30.5 % — ABNORMAL LOW (ref 39.0–52.0)
Hemoglobin: 9.8 g/dL — ABNORMAL LOW (ref 13.0–17.0)
MCH: 28.7 pg (ref 26.0–34.0)
MCHC: 32.1 g/dL (ref 30.0–36.0)
MCV: 89.2 fL (ref 80.0–100.0)
Platelets: 260 10*3/uL (ref 150–400)
RBC: 3.42 MIL/uL — ABNORMAL LOW (ref 4.22–5.81)
RDW: 12.5 % (ref 11.5–15.5)
WBC: 4.4 10*3/uL (ref 4.0–10.5)
nRBC: 0 % (ref 0.0–0.2)

## 2021-06-09 LAB — HIV ANTIBODY (ROUTINE TESTING W REFLEX): HIV Screen 4th Generation wRfx: NONREACTIVE

## 2021-06-09 LAB — PROTIME-INR
INR: 1 (ref 0.8–1.2)
Prothrombin Time: 12.8 seconds (ref 11.4–15.2)

## 2021-06-09 MED ORDER — ISOSORBIDE MONONITRATE ER 30 MG PO TB24
30.0000 mg | ORAL_TABLET | Freq: Every day | ORAL | 3 refills | Status: AC
Start: 2021-06-10 — End: ?
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
## 2021-06-09 NOTE — Discharge Instructions (Signed)
Medication changes: - STOP aspirin 81mg  daily as your blood counts were a little low and this medication can lead to bleeding - STOP atorvastatin (Lipitor) 80mg  daily as your liver function tests were abnormal this admission. We will plan to recheck these levels in 2-3 weeks outpatient - START isosorbid mononitrate (imdur) 30mg  daily - this is a long acting nitroglycerin which will hopefully minimize your chest pain episodes

## 2021-06-09 NOTE — Significant Event (Signed)
Rapid Response Event Note   Reason for Call :  ST elevation on monitor.   Initial Focused Assessment:  Pt sitting on side of bed in no visible distress. He denies chest pain/SOB.   T-98, HR-66, BP-122/75, RR-18, SpO2-99% on RA  Interventions:  No RRT interventions needed.   Plan of Care:  Pt's monitor shows STE. Earlier EKG reviewed showing the same. Per notes, MD is aware of STE. Plan per notes is to observe on 6E, cycle trops, and repeat EKG in AM.  Continue to monitor pt closely. Call RRT if further assistance needed.   Event Summary:   MD Notified:  Call Fridley, Rennae Ferraiolo Anderson, RN

## 2021-06-09 NOTE — Progress Notes (Signed)
Pt safely discharged. Discharge packet provided with teach-back method. VS wnL and as per flow. IVs removed, Pt verbalized understanding. All questions and concerns addressed. Brother present for transport.
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
## 2021-06-27 ENCOUNTER — Ambulatory Visit: Payer: 59 | Admitting: Physician Assistant

## 2021-06-27 ENCOUNTER — Other Ambulatory Visit: Payer: Self-pay

## 2021-06-27 ENCOUNTER — Encounter: Payer: Self-pay | Admitting: Physician Assistant

## 2021-06-27 VITALS — BP 98/62 | HR 97 | Resp 20 | Ht 67.0 in | Wt 123.0 lb

## 2021-06-27 DIAGNOSIS — I10 Essential (primary) hypertension: Secondary | ICD-10-CM

## 2021-06-27 DIAGNOSIS — R7989 Other specified abnormal findings of blood chemistry: Secondary | ICD-10-CM | POA: Diagnosis not present

## 2021-06-27 DIAGNOSIS — I2510 Atherosclerotic heart disease of native coronary artery without angina pectoris: Secondary | ICD-10-CM

## 2021-06-27 DIAGNOSIS — D649 Anemia, unspecified: Secondary | ICD-10-CM

## 2021-06-27 NOTE — Progress Notes (Signed)
Cardiology Office Note:    Date:  06/28/2021   ID:  Austin Hawkins, DOB [birth year], MRN [id number]  PCP:  Ledora Bottcher, [facility] Providers Cardiologist:  Glenetta Hew, MD     Referring MD: Ledora Bottcher, [place]   Chief Complaint  Patient presents with   Follow-up    Seen for Dr. Ellyn Hack    History of Present Illness:    Austin Hawkins is a 61 y.o. male with a hx of nonobstructive CAD, hypertension, hyperlipidemia, former tobacco abuse and prostate cancer.  Patient was previously admitted with NSTEMI on 05/08/2021.  Serial troponin was elevated up to greater than 4000.  Echocardiogram showed EF 60 to 65% with normal wall motion, grade 1 DD, no significant valvular issue.  Cardiac catheterization revealed 100% occlusion of a very small OM 2 that was not amenable to PCI but otherwise nonobstructive disease.  Medical therapy was recommended and he was discharged on the following day.  Unfortunately he came back to the ED multiple times since then and was recurrent chest pain.  He has been seen for posthospital follow-up on 06/01/2021 by Nicholes Rough PA-C at which time he still complaining of intermittent chest discomfort.There was some concern of pericarditis.  Sed rate was mildly elevated, however CRP was normal.  Patient went back to the ED on 06/08/2021 and was admitted overnight for chest discomfort and the palpitation.  Serial troponin were 19--22--19.  He was placed in observation and did not experience any recurrent symptoms overnight.  He was started on Imdur 30 mg daily for antianginal effect.  Hemoglobin was down to 9.4, however remained stable during the admission.  Aspirin was discontinued.  There was no clear etiology behind his chest discomfort.  His Lipitor was also held due to elevated LFT.  The plan was to recheck fatigue in 2 to 3 weeks after discharge and may restart at a lower dose we will refer patient for PCSK9 inhibitor.  Patient presents today for  follow-up.  He denies any further chest discomfort.  He works again [employer] and says his chest pain started after his machine gave off increased smoke recently.  Overall, he has been doing quite well since discharge.  He has no lower extremity edema, orthopnea or PND.  Past Medical History:  Diagnosis Date   Allergy    Aortic atherosclerosis ([place])    CAD (coronary artery disease)    Cancer (HCC)    Prostate   COPD (chronic obstructive pulmonary disease) ([place])    not yet formally diagnosed but seen on CXR 04/2021   Essential hypertension    Hyperlipidemia    Prostate cancer ([place])    Tobacco use     Past Surgical History:  Procedure Laterality Date   LEFT HEART CATH AND CORONARY ANGIOGRAPHY N/A 05/09/2021   Procedure: LEFT HEART CATH AND CORONARY ANGIOGRAPHY;  Surgeon: Burnell Blanks, MD;  Location: [facility];  Service: Cardiovascular;  Laterality: N/A;   ROBOT ASSISTED LAPAROSCOPIC RADICAL PROSTATECTOMY Bilateral 01/08/2017   Procedure: XI ROBOTIC ASSISTED LAPAROSCOPIC RADICAL PROSTATECTOMY WITH BILATERAL PELVIC  NODE  DISSECTON;  Surgeon: Ardis Hughs, MD;  Location: [facility];  Service: Urology;  Laterality: Bilateral;    Current Medications: Current Meds  Medication Sig   amLODipine (NORVASC) 5 MG tablet Take 5 mg by mouth daily.   clopidogrel (PLAVIX) 75 MG tablet Take 1 tablet (75 mg total) by mouth daily.   isosorbide mononitrate (IMDUR) 30 MG 24 hr tablet  Take 1 tablet (30 mg total) by mouth daily.   metoprolol succinate (TOPROL XL) 50 MG 24 hr tablet Take 1 tablet (50 mg total) by mouth daily. Take with or immediately following a meal.   nitroGLYCERIN (NITROSTAT) 0.4 MG SL tablet Place 1 tablet (0.4 mg total) under the tongue every 5 (five) minutes as needed for chest pain.   pantoprazole (PROTONIX) 40 MG tablet Take 1 tablet (40 mg total) by mouth daily.   triamcinolone ointment (KENALOG) 0.1 % Apply 1 application topically in the morning and  at bedtime. Uses on legs     Allergies:   Wool alcohol [lanolin]   Social History   Socioeconomic History   Marital status: Single    Spouse name: Not on file   Number of children: Not on file   Years of education: Not on file   Highest education level: Not on file  Occupational History   Not on file  Tobacco Use   Smoking status: Former    Packs/day: 1.00    Types: Cigarettes   Smokeless tobacco: Never  Substance and Sexual Activity   Alcohol use: Not Currently    Comment: former use   Drug use: Not Currently    Types: Marijuana    Comment: none currently   Sexual activity: Not on file  Other Topics Concern   Not on file  Social History Narrative   Not on file   Social Determinants of Health   Financial Resource Strain: Not on file  Food Insecurity: Not on file  Transportation Needs: Not on file  Physical Activity: Not on file  Stress: Not on file  Social Connections: Not on file     Family History: The patient's family history includes Coronary artery disease in his mother and sister; Stroke in his father.  ROS:   Please see the history of present illness.     All other systems reviewed and are negative.  EKGs/Labs/Other Studies Reviewed:    The following studies were reviewed today:  Echo 05/09/2021  1. Left ventricular ejection fraction, by estimation, is 60 to 65%. The  left ventricle has normal function. The left ventricle has no regional  wall motion abnormalities. Left ventricular diastolic parameters are  consistent with Grade I diastolic  dysfunction (impaired relaxation).   2. Right ventricular systolic function is normal. The right ventricular  size is normal.   3. The mitral valve is normal in structure. No evidence of mitral valve  regurgitation. No evidence of mitral stenosis.   4. The aortic valve is tricuspid. Aortic valve regurgitation is not  visualized. No aortic stenosis is present.   5. The inferior vena cava is normal in size  with greater than 50%  respiratory variability, suggesting right atrial pressure of 3 mmHg.   EKG:  EKG is not ordered today.    Recent Labs: 06/08/2021: BUN 20; Creatinine, Ser 0.92; Potassium 4.4; Sodium 137; TSH 1.160 06/27/2021: ALT 35; Hemoglobin 11.0; Platelets 333  Recent Lipid Panel    Component Value Date/Time   CHOL 132 06/07/2021 1556   TRIG 56 06/07/2021 1556   HDL 47 06/07/2021 1556   CHOLHDL 2.8 06/07/2021 1556   CHOLHDL 2.6 05/10/2021 0139   VLDL 16 05/10/2021 0139   LDLCALC 73 06/07/2021 1556     Risk Assessment/Calculations:           Physical Exam:    VS:  BP 98/62 (BP Location: Left Arm, Patient Position: Sitting, Cuff Size: Normal)    Pulse  97    Resp 20    Ht 5\' 7"  (1.702 m)    Wt 123 lb (55.8 kg)    SpO2 97%    BMI 19.26 kg/m     Wt Readings from Last 3 Encounters:  06/27/21 123 lb (55.8 kg)  06/08/21 121 lb 4.1 oz (55 kg)  06/07/21 122 lb (55.3 kg)     GEN:  Well nourished, well developed in no acute distress HEENT: Normal NECK: No JVD; No carotid bruits LYMPHATICS: No lymphadenopathy CARDIAC: RRR, no murmurs, rubs, gallops RESPIRATORY:  Clear to auscultation without rales, wheezing or rhonchi  ABDOMEN: Soft, non-tender, non-distended MUSCULOSKELETAL:  No edema; No deformity  SKIN: Warm and dry NEUROLOGIC:  Alert and oriented x 3 PSYCHIATRIC:  Normal affect   ASSESSMENT:    1. Anemia, unspecified type   2. Elevated LFTs   3. Coronary artery disease involving native coronary artery of native heart without angina pectoris   4. Hypertension, unspecified type    PLAN:    In order of problems listed above:  Anemia: Seen by GI service who recommended obtaining iron panel, ferritin and a CBC  Elevated LFT: Obtain hepatic function panel.  CAD: Denies any chest pain since discharge.  Continue Plavix  Hypertension: Blood pressure stable      Cardiac Rehabilitation Eligibility Assessment  The patient is ready to start cardiac  rehabilitation from a cardiac standpoint.          Medication Adjustments/Labs and Tests Ordered: Current medicines are reviewed at length with the patient today.  Concerns regarding medicines are outlined above.  Orders Placed This Encounter  Procedures   Vitamin B12   Folate   Iron and TIBC   Ferritin   CBC   Hepatic function panel   No orders of the defined types were placed in this encounter.   Patient Instructions  Medication Instructions:  Decrease Indur take 1/2 tablet daily   *If you need a refill on your cardiac medications before your next appointment, please call your pharmacy*   Lab Work: Your physician recommends that you complete lab work today CBC HFP AMB Anemia Panel(vit B12, folate iron tibc, ferritin)   If you have labs (blood work) drawn today and your tests are completely normal, you will receive your results only by: [place] (if you have MyChart) OR A paper copy in the mail If you have any lab test that is abnormal or we need to change your treatment, we will call you to review the results.   Testing/Procedures: NONE ordered at this time of appointment     Follow-Up: At [facility], you and your health needs are our priority.  As part of our continuing mission to provide you with exceptional heart care, we have created designated Provider Care Teams.  These Care Teams include your primary Cardiologist (physician) and Advanced Practice Providers (APPs -  Physician Assistants and Nurse Practitioners) who all work together to provide you with the care you need, when you need it.  We recommend signing up for the patient portal called "MyChart".  Sign up information is provided on this After Visit Summary.  MyChart is used to connect with patients for Virtual Visits (Telemedicine).  Patients are able to view lab/test results, encounter notes, upcoming appointments, etc.  Non-urgent messages can be sent to your provider as well.   To learn  more about what you can do with MyChart, go to [email or web address].    Your next appointment:   3 month(s)  The format for your next appointment:   In Person  Provider:   Glenetta Hew, MD     Other Instructions We will forward lab results [place] PA-C of [facility]    Signed, Almyra Deforest, [place]  06/28/2021 11:58 PM    [facility]

## 2021-06-27 NOTE — Patient Instructions (Addendum)
Medication Instructions:  Decrease Indur take 1/2 tablet daily   *If you need a refill on your cardiac medications before your next appointment, please call your pharmacy*   Lab Work: Your physician recommends that you complete lab work today CBC HFP AMB Anemia Panel(vit B12, folate iron tibc, ferritin)   If you have labs (blood work) drawn today and your tests are completely normal, you will receive your results only by: [place] (if you have MyChart) OR A paper copy in the mail If you have any lab test that is abnormal or we need to change your treatment, we will call you to review the results.   Testing/Procedures: NONE ordered at this time of appointment     Follow-Up: At [facility], you and your health needs are our priority.  As part of our continuing mission to provide you with exceptional heart care, we have created designated Provider Care Teams.  These Care Teams include your primary Cardiologist (physician) and Advanced Practice Providers (APPs -  Physician Assistants and Nurse Practitioners) who all work together to provide you with the care you need, when you need it.  We recommend signing up for the patient portal called "MyChart".  Sign up information is provided on this After Visit Summary.  MyChart is used to connect with patients for Virtual Visits (Telemedicine).  Patients are able to view lab/test results, encounter notes, upcoming appointments, etc.  Non-urgent messages can be sent to your provider as well.   To learn more about what you can do with MyChart, go to [email or web address].    Your next appointment:   3 month(s)  The format for your next appointment:   In Person  Provider:   Glenetta Hew, MD     Other Instructions We will forward lab results toTina Galen [place] of [facility]

## 2021-06-28 ENCOUNTER — Encounter: Payer: Self-pay | Admitting: Physician Assistant

## 2021-06-28 LAB — CBC
Hematocrit: 33.4 % — ABNORMAL LOW (ref 37.5–51.0)
Hemoglobin: 11 g/dL — ABNORMAL LOW (ref 13.0–17.7)
MCH: 28.9 pg (ref 26.6–33.0)
MCHC: 32.9 g/dL (ref 31.5–35.7)
MCV: 88 fL (ref 79–97)
Platelets: 333 10*3/uL (ref 150–450)
RBC: 3.8 x10E6/uL — ABNORMAL LOW (ref 4.14–5.80)
RDW: 11.8 % (ref 11.6–15.4)
WBC: 4.3 10*3/uL (ref 3.4–10.8)

## 2021-06-28 LAB — IRON AND TIBC
Iron Saturation: 22 % (ref 15–55)
Iron: 62 ug/dL (ref 38–169)
Total Iron Binding Capacity: 279 ug/dL (ref 250–450)
UIBC: 217 ug/dL (ref 111–343)

## 2021-06-28 LAB — FERRITIN: Ferritin: 414 ng/mL — ABNORMAL HIGH (ref 30–400)

## 2021-06-28 LAB — HEPATIC FUNCTION PANEL
ALT: 35 IU/L (ref 0–44)
AST: 23 IU/L (ref 0–40)
Albumin: 4.4 g/dL (ref 3.8–4.8)
Alkaline Phosphatase: 81 IU/L (ref 44–121)
Bilirubin Total: <0.2 mg/dL (ref 0.0–1.2)
Bilirubin, Direct: <0.1 mg/dL (ref 0.00–0.40)
Total Protein: 7.4 g/dL (ref 6.0–8.5)

## 2021-06-28 LAB — VITAMIN B12: Vitamin B-12: 412 pg/mL (ref 232–1245)

## 2021-06-28 LAB — FOLATE: Folate: 8.7 ng/mL (ref >3.0)

## 2021-07-20 ENCOUNTER — Encounter (HOSPITAL_COMMUNITY): Payer: Self-pay

## 2021-07-20 ENCOUNTER — Telehealth (HOSPITAL_COMMUNITY): Payer: Self-pay

## 2021-07-20 NOTE — Telephone Encounter (Signed)
Pt insurance is active and benefits verified through [facility] Co-pay $25.00, DED $3,500.00/$3,500.00 met, out of pocket $8,150.00/$6,465.40 met, co-insurance 0%. No pre-authorization required. Passport, 07/20/21 @ 1:52PM, PUG#[id number] ?  ?Will contact patient to see if he is interested in the Cardiac Rehab Program. ?

## 2021-07-20 NOTE — Telephone Encounter (Signed)
Attempted to call patient in regards to Cardiac Rehab - LM on VM Mailed letter 

## 2021-08-07 ENCOUNTER — Telehealth (HOSPITAL_COMMUNITY): Payer: Self-pay

## 2021-08-07 NOTE — Telephone Encounter (Signed)
No response from pt regarding CR.  Closed referral.  

## 2021-10-01 ENCOUNTER — Ambulatory Visit (INDEPENDENT_AMBULATORY_CARE_PROVIDER_SITE_OTHER): Payer: 59 | Admitting: Cardiology

## 2021-10-01 ENCOUNTER — Encounter: Payer: Self-pay | Admitting: Cardiology

## 2021-10-01 VITALS — BP 124/64 | HR 68 | Ht 67.5 in | Wt 127.6 lb

## 2021-10-01 DIAGNOSIS — I10 Essential (primary) hypertension: Secondary | ICD-10-CM

## 2021-10-01 DIAGNOSIS — I2510 Atherosclerotic heart disease of native coronary artery without angina pectoris: Secondary | ICD-10-CM | POA: Diagnosis not present

## 2021-10-01 DIAGNOSIS — I214 Non-ST elevation (NSTEMI) myocardial infarction: Secondary | ICD-10-CM

## 2021-10-01 DIAGNOSIS — E782 Mixed hyperlipidemia: Secondary | ICD-10-CM

## 2021-10-01 DIAGNOSIS — Z720 Tobacco use: Secondary | ICD-10-CM

## 2021-10-01 NOTE — Progress Notes (Signed)
 Primary Care Provider: Pcp, No [facility] Cardiologist: David Harding, MD Electrophysiologist: None  Clinic Note: Chief Complaint  Patient presents with   Follow-up    3 months.  No major issues.   ===================================  ASSESSMENT/PLAN   Problem List Items Addressed This Visit       Cardiology Problems   NSTEMI (non-ST elevated myocardial infarction) (HCC) (Chronic)    Troponin elevation in the setting of what seemed like ACS, but no obstructive CAD noted besides a small caliber branch.  He has had intermittent chest pain off and on since.  Currently doing well.        Relevant Medications   atorvastatin (LIPITOR) 80 MG tablet   Other Relevant Orders   Lipid panel   Comprehensive metabolic panel   Essential hypertension (Chronic)    BP well controlled.  No change to current doses of amlodipine, Imdur and metoprolol succinate/Toprol      Relevant Medications   atorvastatin (LIPITOR) 80 MG tablet   Other Relevant Orders   Lipid panel   Comprehensive metabolic panel   CAD (coronary artery disease) - Primary (Chronic)    No real significant CAD.  He is on Plavix because of ACS.  But could be interrupted now that he is 6 months out and does not have stent.  Continues on statin. On Imdur, Toprol and amlodipine.  Anginal benefit      Relevant Medications   atorvastatin (LIPITOR) 80 MG tablet   Other Relevant Orders   Lipid panel   Comprehensive metabolic panel   Hyperlipidemia (Chronic)    He does have some moderate CAD.  Most recent LDL was 73 on atorvastatin.  I guess his atorvastatin was restarted.  At goal for now.  I did recommend he TID takes his atorvastatin in the evening to avoid potential fatigue issues associated with it.   Continue to follow-up with LFTs.      Relevant Medications   atorvastatin (LIPITOR) 80 MG tablet   Other Relevant Orders   Lipid panel   Comprehensive metabolic panel     Other   Tobacco abuse    Smoking  cessation instruction/counseling given:  counseled patient on the dangers of tobacco use, advised patient to stop smoking, and reviewed strategies to maximize success       ===================================  HPI:    Austin Hawkins is a 61 y.o. male former smoker with PMH notable for non-STEMI in 04/2021 (culprit lesion was likely very small OM 2 100% CTO not amenable to PCI), HTN, HLD, prostate cancer being seen today for the follow-up evaluation.   Recent Hospitalizations:  05/07/2021 -presented to Urgent Care with c/o CP -> mild early upsloping ST segments without reciprocal changes.  At bedtime troponin 4575.  With diagnosis of non-STEMI, referred for catheter revealing a small OM branch occluded.  Echo essentially normal. 05/25/2021-went back to ER with atypical chest pain that occurred when he was smoking a cigarette.  Ruled out with negative enzymes.  Very minimal off of chest pain associated with any particular therapy or exertion.    She was seen by Tessa Conte on June 07, 2021.  Interestingly, he did not have any significant symptoms of persistent chest pain.  Just random off-and-on pain not necessarily associate with any particular activity or symptoms.  No sense of fluttering or palpitations.  No syncope or near syncope.  Just occasional lightheadedness and dizziness with bending over.  Discussed adequate hydration. => ESR & CRP ordered (normal CRP but ESR was   fully elevated-unlikely to be pericarditis).  Amlodipine continued.  However concerning symptoms of orthostatic hypotension-encourage adequate p.o. intake.  June 08, 2021: Again admitted for chest pain.  Noted mild stable anemia with hemoglobin 9.4.  Consider possible reason for chest pain with low cannulation.  Recommended pulmonary referral.  Anemia panel showed mildly elevated creatinine. => Aspirin discontinued; atorvastatin held because of transaminitis.  Plan was to check liver function tests in 2 to 3 weeks. LFTs  were elevated and therefore statin held.  Recommended PCSK9 inhibitor.  Started on Imdur with amlodipine and Toprol.  Continue to statin and Plavix. Discharge hemoglobin 9.8  Austin Hawkins was last seen on 06/27/2021 by Almyra Deforest, PA-C no further chest pain or pressure.  Noted that chest pain began when his machine EF lots of smoke.   Reviewed  CV studies:    The following studies were reviewed today: (if available, images/films reviewed: From Epic Chart or Care Everywhere) CARDIAC CATH 05/09/2021:    Mid RCA lesion is 20% stenosed.   Mid LAD lesion is 30% stenosed.   2nd Mrg (<1 mm vessel) lesion is 100% stenosed.    Echo 05/09/2021: LVEF 60 to 65%.  No RWMA.  GR 1 DD.  Normal RV size and function.  Normal MV.  Normal AOV.  Normal RAP.  Interval History:   Austin Hawkins presents here today overall doing pretty well.  He says he just has off-and-on a little bit of chest pain but nothing really associated with any particular activity.  Nothing prolonged and nothing that is worrisome. Occasionally has skipped beats but nothing that makes it feel like he is going fast or too slow.  He does note a little bit of fatigue and lack of patient to do things.  No PND orthopnea or edema.  No real exertional chest pain.  CV Review of Symptoms (Summary) Cardiovascular ROS: no chest pain or dyspnea on exertion positive for - irregular heartbeat and -off & on,usually feels it when heart is slow negative for - edema, orthopnea, palpitations, paroxysmal nocturnal dyspnea, rapid heart rate, shortness of breath, or syncope/near syncope; TIA/amaurosis fugax; claudication  REVIEWED OF SYSTEMS   Review of Systems - Negative except for occasional skipped beats and mild overall chest pain. General ROS: positive for  - ` mild exertional fatigue negative for - fatigue, fever, night sweats, or sleep disturbance Hematological and Lymphatic ROS: positive for - bruising negative for - bleeding problems, blood clots,  blood transfusions, bruising, fatigue, or pallor   I have reviewed and (if needed) personally updated the patient's problem list, medications, allergies, past medical and surgical history, social and family history.   PAST MEDICAL HISTORY   Past Medical History:  Diagnosis Date   Allergy    Aortic atherosclerosis ([place])    CAD (coronary artery disease) 04/2021   NSTEMI-RCA 12%, mid LAD 30%.  OM 2 (less than 1 mm vessel) occluded.   Cancer [facility])    Prostate   COPD (chronic obstructive pulmonary disease) ([place])    not yet formally diagnosed but seen on CXR 04/2021   Essential hypertension    Hyperlipidemia    Prostate cancer ([place])    Tobacco use     PAST SURGICAL HISTORY   Past Surgical History:  Procedure Laterality Date   LEFT HEART CATH AND CORONARY ANGIOGRAPHY N/A 05/09/2021   Procedure: LEFT HEART CATH AND CORONARY ANGIOGRAPHY;  Surgeon: Burnell Blanks, MD;  Location: [place] [facility];: Mid RCA lesion is 20% stenosed.  Mid LAD lesion is 30% stenosed.   2nd Mrg (<1 mm vessel) lesion is 100% stenosed. (culprit)   ROBOT ASSISTED LAPAROSCOPIC RADICAL PROSTATECTOMY Bilateral 01/08/2017   Procedure: XI ROBOTIC ASSISTED LAPAROSCOPIC RADICAL PROSTATECTOMY WITH BILATERAL PELVIC  NODE  DISSECTON;  Surgeon: Herrick, Benjamin W, MD;  Location: [facility];  Service: Urology;  Laterality: Bilateral;   TRANSTHORACIC ECHOCARDIOGRAM  05/09/2021   LVEF 60 to 65%.  No RWMA.  GR 1 DD.  Normal RV size and function.  Normal MV.  Normal AOV.  Normal RAP.    Immunization History  Administered Date(s) Administered   Influenza,inj,Quad PF,6+ Mos 08/08/2016   Tdap 08/29/2015    MEDICATIONS/ALLERGIES   Current Meds  Medication Sig   amLODipine (NORVASC) 5 MG tablet Take 5 mg by mouth daily.   clopidogrel (PLAVIX) 75 MG tablet Take 1 tablet (75 mg total) by mouth daily.   isosorbide mononitrate (IMDUR) 30 MG 24 hr tablet Take 1 tablet (30 mg total) by mouth daily.   metoprolol succinate  (TOPROL XL) 50 MG 24 hr tablet Take 1 tablet (50 mg total) by mouth daily. Take with or immediately following a meal.   nitroGLYCERIN (NITROSTAT) 0.4 MG SL tablet Place 1 tablet (0.4 mg total) under the tongue every 5 (five) minutes as needed for chest pain.   pantoprazole (PROTONIX) 40 MG tablet Take 1 tablet (40 mg total) by mouth daily.   triamcinolone ointment (KENALOG) 0.1 % Apply 1 application topically in the morning and at bedtime. Uses on legs    Allergies  Allergen Reactions   Wool Alcohol [Lanolin] Hives and Swelling    SOCIAL HISTORY/FAMILY HISTORY   Reviewed in Epic:   Social History   Tobacco Use   Smoking status: Former    Packs/day: 1.00    Types: Cigarettes   Smokeless tobacco: Never  Substance Use Topics   Alcohol use: Not Currently    Comment: former use   Drug use: Not Currently    Types: Marijuana    Comment: none currently   Social History   Social History Narrative   Not on file   Family History  Problem Relation Age of Onset   Coronary artery disease Mother    Stroke Father    Coronary artery disease Sister     OBJCTIVE -PE, EKG, labs   Wt Readings from Last 3 Encounters:  10/01/21 127 lb 9.6 oz (57.9 kg)  06/27/21 123 lb (55.8 kg)  06/08/21 121 lb 4.1 oz (55 kg)    Physical Exam: BP 124/64   Pulse 68   Ht 5' 7.5" (1.715 m)   Wt 127 lb 9.6 oz (57.9 kg)   SpO2 100%   BMI 19.69 kg/m  Physical Exam Constitutional:      General: He is not in acute distress.    Appearance: Normal appearance. He is not ill-appearing.  HENT:     Head: Normocephalic and atraumatic.     Mouth/Throat:     Mouth: Mucous membranes are moist.     Pharynx: Oropharyngeal exudate present.  Eyes:     Extraocular Movements: Extraocular movements intact.     Pupils: Pupils are equal, round, and reactive to light.  Cardiovascular:     Pulses: Normal pulses.     Heart sounds: Normal heart sounds.  Pulmonary:     Effort: Pulmonary effort is normal.      Breath sounds: Normal breath sounds. No wheezing, rhonchi or rales.  Chest:     Chest wall: No tenderness.    Abdominal:     General: There is no distension.     Palpations: There is no mass.     Tenderness: There is no abdominal tenderness. There is no guarding.     Hernia: No hernia is present.  Skin:    General: Skin is warm and dry.  Neurological:     General: No focal deficit present.     Mental Status: He is alert and oriented to person, place, and time. Mental status is at baseline.  Psychiatric:        Mood and Affect: Mood normal.        Behavior: Behavior normal.        Judgment: Judgment normal.     Adult ECG Report NA  Recent Labs: Reviewed Lab Results  Component Value Date   CHOL 132 06/07/2021   HDL 47 06/07/2021   LDLCALC 73 06/07/2021   TRIG 56 06/07/2021   CHOLHDL 2.8 06/07/2021   Lab Results  Component Value Date   CREATININE 0.92 06/08/2021   BUN 20 06/08/2021   NA 137 06/08/2021   K 4.4 06/08/2021   CL 109 06/08/2021   CO2 22 06/08/2021      Latest Ref Rng & Units 06/27/2021    4:07 PM 06/09/2021    4:44 AM 06/08/2021    2:56 PM  CBC  WBC 3.4 - 10.8 x10E3/uL 4.3  4.4  4.1   Hemoglobin 13.0 - 17.7 g/dL 11.0  9.8  9.4   Hematocrit 37.5 - 51.0 % 33.4  30.5  29.5   Platelets 150 - 450 x10E3/uL 333  260  216     Lab Results  Component Value Date   HGBA1C 5.6 05/10/2021   Lab Results  Component Value Date   TSH 1.160 06/08/2021    ==================================================  COVID-19 Education: The signs and symptoms of COVID-19 were discussed with the patient and how to seek care for testing (follow up with PCP or arrange E-visit).    I spent a total of 20 minutes with the patient spent in direct patient consultation.  Additional time spent with chart review  / charting (studies, outside notes, etc): 25 min Total Time: 45 min  Current medicines are reviewed at length with the patient today.  (+/- concerns) n/a  This visit  occurred during the SARS-CoV-2 public health emergency.  Safety protocols were in place, including screening questions prior to the visit, additional usage of staff PPE, and extensive cleaning of exam room while observing appropriate contact time as indicated for disinfecting solutions.  Notice: This dictation was prepared with Dragon dictation along with smart phrase technology. Any transcriptional errors that result from this process are unintentional and may not be corrected upon review.   Studies Ordered:  Orders Placed This Encounter  Procedures   Lipid panel   Comprehensive metabolic panel   No orders of the defined types were placed in this encounter.   Patient Instructions / Medication Changes & Studies & Tests Ordered   Patient Instructions  Medication Instructions:    No changes   If your blood pressure is low or you feel dizzy one day - may not take Amlodipine that morning.  *If you need a refill on your cardiac medications before your next appointment, please call your pharmacy*   Lab Work: Aug 2023 Fasting - nothing to eat or drink  except water and medications CMP Lipid  If you have labs (blood work) drawn today and your tests are completely normal, you will receive  your results only by: MyChart Message (if you have MyChart) OR A paper copy in the mail If you have any lab test that is abnormal or we need to change your treatment, we will call you to review the results.   Testing/Procedures:  Not needed  Follow-Up: At [facility], you and your health needs are our priority.  As part of our continuing mission to provide you with exceptional heart care, we have created designated Provider Care Teams.  These Care Teams include your primary Cardiologist (physician) and Advanced Practice Providers (APPs -  Physician Assistants and Nurse Practitioners) who all work together to provide you with the care you need, when you need it.     Your next appointment:   6  month(s)  The format for your next appointment:   In Person  Provider:   David Harding, MD        David Harding, M.D., M.S. Interventional Cardiologist   Pager # [phone] Phone # [phone] [address] [place], [place] [address]   Thank you for choosing [facility]!!    

## 2021-10-01 NOTE — Patient Instructions (Addendum)
Medication Instructions:    No changes   If your blood pressure is low or you feel dizzy one day - may not take Amlodipine that morning.  *If you need a refill on your cardiac medications before your next appointment, please call your pharmacy*   Lab Work: Aug 2023 Fasting - nothing to eat or drink  except water and medications CMP Lipid  If you have labs (blood work) drawn today and your tests are completely normal, you will receive your results only by: [place] (if you have MyChart) OR A paper copy in the mail If you have any lab test that is abnormal or we need to change your treatment, we will call you to review the results.   Testing/Procedures:  Not needed  Follow-Up: At [facility], you and your health needs are our priority.  As part of our continuing mission to provide you with exceptional heart care, we have created designated Provider Care Teams.  These Care Teams include your primary Cardiologist (physician) and Advanced Practice Providers (APPs -  Physician Assistants and Nurse Practitioners) who all work together to provide you with the care you need, when you need it.     Your next appointment:   6 month(s)  The format for your next appointment:   In Person  Provider:   Glenetta Hew, MD

## 2021-11-07 ENCOUNTER — Encounter: Payer: Self-pay | Admitting: Cardiology

## 2021-11-07 NOTE — Assessment & Plan Note (Signed)
No real significant CAD.  He is on Plavix because of ACS.  But could be interrupted now that he is 6 months out and does not have stent.  Continues on statin. On Imdur, Toprol and amlodipine.  Anginal benefit

## 2021-11-07 NOTE — Assessment & Plan Note (Signed)
Troponin elevation in the setting of what seemed like ACS, but no obstructive CAD noted besides a small caliber branch.  He has had intermittent chest pain off and on since.  Currently doing well.

## 2021-11-07 NOTE — Assessment & Plan Note (Signed)
BP well controlled.  No change to current doses of amlodipine, Imdur and metoprolol succinate/Toprol

## 2021-11-07 NOTE — Assessment & Plan Note (Signed)
He does have some moderate CAD.  Most recent LDL was 73 on atorvastatin.  I guess his atorvastatin was restarted.  At goal for now.  I did recommend he TID takes his atorvastatin in the evening to avoid potential fatigue issues associated with it.   Continue to follow-up with LFTs.

## 2021-11-07 NOTE — Assessment & Plan Note (Signed)
Smoking cessation instruction/counseling given:  counseled patient on the dangers of tobacco use, advised patient to stop smoking, and reviewed strategies to maximize success 

## 2022-01-08 LAB — COMPREHENSIVE METABOLIC PANEL
ALT: 26 IU/L (ref 0–44)
AST: 25 IU/L (ref 0–40)
Albumin/Globulin Ratio: 1.5 (ref 1.2–2.2)
Albumin: 4.8 g/dL (ref 3.9–4.9)
Alkaline Phosphatase: 57 IU/L (ref 44–121)
BUN/Creatinine Ratio: 17 (ref 10–24)
BUN: 17 mg/dL (ref 8–27)
Bilirubin Total: 0.7 mg/dL (ref 0.0–1.2)
CO2: 23 mmol/L (ref 20–29)
Calcium: 9.5 mg/dL (ref 8.6–10.2)
Chloride: 104 mmol/L (ref 96–106)
Creatinine, Ser: 0.99 mg/dL (ref 0.76–1.27)
Globulin, Total: 3.2 g/dL (ref 1.5–4.5)
Glucose: 85 mg/dL (ref 70–99)
Potassium: 4.8 mmol/L (ref 3.5–5.2)
Sodium: 139 mmol/L (ref 134–144)
Total Protein: 8 g/dL (ref 6.0–8.5)
eGFR: 86 mL/min/{1.73_m2} (ref >59)

## 2022-01-08 LAB — LIPID PANEL
Chol/HDL Ratio: 1.9 ratio (ref 0.0–5.0)
Cholesterol, Total: 126 mg/dL (ref 100–199)
HDL: 66 mg/dL (ref >39)
LDL Chol Calc (NIH): 45 mg/dL (ref 0–99)
Triglycerides: 73 mg/dL (ref 0–149)
VLDL Cholesterol Cal: 15 mg/dL (ref 5–40)

## 2022-04-08 ENCOUNTER — Encounter: Payer: Self-pay | Admitting: Cardiology

## 2022-04-08 ENCOUNTER — Ambulatory Visit: Payer: 59 | Attending: Cardiology | Admitting: Cardiology

## 2022-04-08 VITALS — BP 140/76 | HR 86 | Ht 60.75 in | Wt 126.8 lb

## 2022-04-08 DIAGNOSIS — I214 Non-ST elevation (NSTEMI) myocardial infarction: Secondary | ICD-10-CM | POA: Diagnosis not present

## 2022-04-08 DIAGNOSIS — I10 Essential (primary) hypertension: Secondary | ICD-10-CM | POA: Diagnosis not present

## 2022-04-08 DIAGNOSIS — E785 Hyperlipidemia, unspecified: Secondary | ICD-10-CM | POA: Diagnosis not present

## 2022-04-08 DIAGNOSIS — I2510 Atherosclerotic heart disease of native coronary artery without angina pectoris: Secondary | ICD-10-CM | POA: Diagnosis not present

## 2022-04-08 DIAGNOSIS — Z720 Tobacco use: Secondary | ICD-10-CM

## 2022-04-08 DIAGNOSIS — E782 Mixed hyperlipidemia: Secondary | ICD-10-CM

## 2022-04-08 NOTE — Patient Instructions (Signed)
Medication Instructions:   Wean off - Isosorbide ( Imdur) - stop taking  if chest pain returns restrat medicaiton   *If you need a refill on your cardiac medications before your next appointment, please call your pharmacy*   Lab Work: 6  months from now - May 2024 Lipid CMP [id number] If you have labs (blood work) drawn today and your tests are completely normal, you will receive your results only by: [place] (if you have MyChart) OR A paper copy in the mail If you have any lab test that is abnormal or we need to change your treatment, we will call you to review the results.   Testing/Procedures:  Not  needed  Follow-Up: At [facility], you and your health needs are our priority.  As part of our continuing mission to provide you with exceptional heart care, we have created designated Provider Care Teams.  These Care Teams include your primary Cardiologist (physician) and Advanced Practice Providers (APPs -  Physician Assistants and Nurse Practitioners) who all work together to provide you with the care you need, when you need it.     Your next appointment:   6 month(s)  The format for your next appointment:   In Person  Provider:   Coletta Memos, FNP    Then, Glenetta Hew, MD will plan to see you again in 12 month(s).   Other Instructions   Purchase a Blood pressure machine and check blood pressure  at least 3 to 4 times a WEEK    BEFORE NEXT VISIT  CHECK YOUR BLOOD PRESSURE FOR 2 WEEKS PRIOR DAILY AND BRING INFORMATION TO THE  THE APPOINTMENT

## 2022-04-08 NOTE — Progress Notes (Signed)
Primary Care Provider: Pcp, No [place] Cardiologist: Glenetta Hew, MD Electrophysiologist: None  Clinic Note: Chief Complaint  Patient presents with   Follow-up    6 month - doing well - just getting over GI Bug   Coronary Artery Disease    No Angina (no more CP - had several episodes post-MI of CP.  No further Sx.)    ===================================  ASSESSMENT/PLAN   Problem List Items Addressed This Visit       Cardiology Problems   NSTEMI (non-ST elevated myocardial infarction) ([place]) (Chronic)    Just about a year out from what sounds like an ACS presentation but no real obstructive disease besides a very small caliber vessel.  He had intermittent chest pain after that but has been doing well ever since.  Preserved EF. Doing well now.  No further chest pain/angina      Relevant Orders   Lipid panel   Comprehensive metabolic panel   Hemoglobin A1c   Essential hypertension (Chronic)    His blood pressures actually been a little up today he remains on amlodipine 5 mg daily and Toprol 50 mg daily.  I think we can wean off Imdur.  If pressures decline would probably increase amlodipine dose to 10 mg before adding any medicine.  Follow home BP 3-4 times a week leading up to PCP and APP follow-up visits.  This way we can know what his pressures are doing at home.      Relevant Orders   Comprehensive metabolic panel   Hemoglobin A1c   Hyperlipidemia with target LDL less than 70 (Chronic)    Moderate CAD with 1 small vessel occluded.  LDL continues to improve.  Most recently 60 as of August.  He is on 80 mg atorvastatin. Recheck labs by next visit.  If he continues to be his well-controlled, would probably reduce to 40 mg atorvastatin.      Coronary artery disease involving native coronary artery of native heart without angina pectoris - Primary (Chronic)    He really did have significant CAD.  Had microvascular disease.  Was on Plavix with the plan to  continue for a year post infarct.  When he sees Coletta Memos in follow-up 6 months for now, will stop Plavix and go back to aspirin 81 mg daily. Continue current dose of statin with well-controlled lipids along with beta-blocker and amlodipine.  If pressures increase we will increase amlodipine to 10 mg. Wean off Imdur.      Relevant Orders   Lipid panel   Comprehensive metabolic panel   Hemoglobin A1c     Other   Tobacco abuse (Chronic)    Smoking cessation instruction/counseling given:  commended patient for quitting and reviewed strategies for preventing relapses        ===================================  HPI:    Austin Hawkins is a 61 y.o. male with a PMH notable for non-STEMI in 04/2021 (culprit lesion was avery small OM 2 100% CTO not amenable to PCI), HTN, HLD, & Prostate Cancer below who presents today for 66-monthfollow-up.  Austin Nicollswas last seen on 10/01/2021-doing very well.  Lipids off-and-on chest pain or near but not still associated activity.  Occasional skipped beats.  Global fatigue.  No heart failure symptoms. No changes in meds.  Labs ordered. If BP low & noting dizziness -= hold Amlodipine  Recent Hospitalizations: none  Reviewed  CV studies:    The following studies were reviewed today: (if available, images/films reviewed: From Epic Chart  or Care Everywhere) none:  Interval History:   Austin Hawkins returns today doing well.  Walks just about every day.  Eating right - drinking plenty of water to avoid dizziness.  Avoiding sugar & sodas - only water & natural juices.  He started a little bit dizzy if he bends over but otherwise not really.  Denies any active cardiac complaints.  He asked about needing to take his PPI.  His GI symptoms have stabilized and has not really been taking the Protonix.  He is only taking a half dose of his Imdur.  CV Review of Symptoms (Summary): no chest pain or dyspnea on exertion positive for - rare orthostatic  dizziness - better since he knows how to handle it negative for - edema, irregular heartbeat, orthopnea, palpitations, paroxysmal nocturnal dyspnea, rapid heart rate, shortness of breath, or syncope/near syncope; TIA/amaurosis fugax; claudication  REVIEWED OF SYSTEMS   Review of Systems  Constitutional:  Negative for malaise/fatigue and weight loss.  HENT:  Negative for congestion and nosebleeds.   Respiratory:  Negative for cough and shortness of breath.   Cardiovascular:        Per HPI  Gastrointestinal:  Negative for blood in stool and melena.       Recent GI bug - resolved  Genitourinary:  Negative for hematuria.  Musculoskeletal:  Negative for falls, joint pain and myalgias.  Neurological:  Positive for dizziness (only if stands up too fast -- RARE). Negative for focal weakness.  Psychiatric/Behavioral:  Negative for depression, hallucinations and memory loss. The patient is not nervous/anxious and does not have insomnia.   All other systems reviewed and are negative.   I have reviewed and (if needed) personally updated the patient's problem list, medications, allergies, past medical and surgical history, social and family history.   PAST MEDICAL HISTORY   Past Medical History:  Diagnosis Date   Allergy    Aortic atherosclerosis ([place])    CAD (coronary artery disease) 04/2021   NSTEMI-RCA 12%, mid LAD 30%.  OM 2 (less than 1 mm vessel) occluded.   Cancer [facility])    Prostate   COPD (chronic obstructive pulmonary disease) ([place])    not yet formally diagnosed but seen on CXR 04/2021   Essential hypertension    Hyperlipidemia    Prostate cancer ([place])    Tobacco use     PAST SURGICAL HISTORY   Past Surgical History:  Procedure Laterality Date   LEFT HEART CATH AND CORONARY ANGIOGRAPHY N/A 05/09/2021   Procedure: LEFT HEART CATH AND CORONARY ANGIOGRAPHY;  Surgeon: Burnell Blanks, MD;  Location: [facility];: Mid RCA lesion is 20% stenosed.   Mid LAD lesion is  30% stenosed.   2nd Mrg (<1 mm vessel) lesion is 100% stenosed. (culprit)   ROBOT ASSISTED LAPAROSCOPIC RADICAL PROSTATECTOMY Bilateral 01/08/2017   Procedure: XI ROBOTIC ASSISTED LAPAROSCOPIC RADICAL PROSTATECTOMY WITH BILATERAL PELVIC  NODE  DISSECTON;  Surgeon: Ardis Hughs, MD;  Location: [facility];  Service: Urology;  Laterality: Bilateral;   TRANSTHORACIC ECHOCARDIOGRAM  05/09/2021   LVEF 60 to 65%.  No RWMA.  GR 1 DD.  Normal RV size and function.  Normal MV.  Normal AOV.  Normal RAP.   CARDIAC CATH 05/09/2021:    Mid RCA lesion is 20% stenosed.   Mid LAD lesion is 30% stenosed.   2nd Mrg (<1 mm vessel) lesion is 100% stenosed.   Immunization History  Administered Date(s) Administered   Influenza,inj,Quad PF,6+ Mos 08/08/2016   Tdap  08/29/2015    MEDICATIONS/ALLERGIES   Current Meds  Medication Sig   amLODipine (NORVASC) 5 MG tablet Take 5 mg by mouth daily.   atorvastatin (LIPITOR) 80 MG tablet Take 80 mg by mouth daily.   clopidogrel (PLAVIX) 75 MG tablet Take 1 tablet (75 mg total) by mouth daily.   isosorbide mononitrate (IMDUR) 30 MG 24 hr tablet Take 1 tablet (30 mg total) by mouth daily.   metoprolol succinate (TOPROL XL) 50 MG 24 hr tablet Take 1 tablet (50 mg total) by mouth daily. Take with or immediately following a meal.   nitroGLYCERIN (NITROSTAT) 0.4 MG SL tablet Place 1 tablet (0.4 mg total) under the tongue every 5 (five) minutes as needed for chest pain.   triamcinolone ointment (KENALOG) 0.1 % Apply 1 application topically in the morning and at bedtime. Uses on legs    Allergies  Allergen Reactions   Wool Alcohol [Lanolin] Hives and Swelling    SOCIAL HISTORY/FAMILY HISTORY   Reviewed in Epic:  Pertinent findings:  Social History   Tobacco Use   Smoking status: Former    Packs/day: 1.00    Types: Cigarettes   Smokeless tobacco: Never  Substance Use Topics   Alcohol use: Not Currently    Comment: former use   Drug use: Not Currently     Types: Marijuana    Comment: none currently   Social History   Social History Narrative   Not on file    OBJCTIVE -PE, EKG, labs   Wt Readings from Last 3 Encounters:  04/08/22 126 lb 12.8 oz (57.5 kg)  10/01/21 127 lb 9.6 oz (57.9 kg)  06/27/21 123 lb (55.8 kg)    Physical Exam: BP (!) 140/76   Pulse 86   Ht 5' 0.75" (1.543 m)   Wt 126 lb 12.8 oz (57.5 kg)   SpO2 99%   BMI 24.16 kg/m  Physical Exam Vitals (Recheck BP 134/72 mmHg) reviewed.  Constitutional:      General: He is not in acute distress.    Appearance: Normal appearance. He is normal weight. He is not ill-appearing or toxic-appearing.     Comments: Well nourished & well groomed.    HENT:     Head: Normocephalic and atraumatic.  Neck:     Vascular: No carotid bruit or JVD.  Cardiovascular:     Rate and Rhythm: Normal rate and regular rhythm. Occasional Extrasystoles are present.    Chest Wall: PMI is not displaced.     Pulses: Normal pulses.     Heart sounds: Normal heart sounds, S1 normal and S2 normal. No murmur heard.    No friction rub. No gallop.  Pulmonary:     Effort: Pulmonary effort is normal. No respiratory distress.     Breath sounds: Normal breath sounds. No wheezing, rhonchi or rales.  Chest:     Chest wall: No tenderness.  Musculoskeletal:        General: No swelling. Normal range of motion.     Cervical back: Normal range of motion and neck supple.  Skin:    General: Skin is warm and dry.  Neurological:     General: No focal deficit present.     Mental Status: He is alert and oriented to person, place, and time.     Gait: Gait normal.  Psychiatric:        Mood and Affect: Mood normal.        Behavior: Behavior normal.        Thought  Content: Thought content normal.        Judgment: Judgment normal.      Adult ECG Report N/a  Recent Labs: Reviewed Lab Results  Component Value Date   CHOL 126 01/08/2022   HDL 66 01/08/2022   LDLCALC 45 01/08/2022   TRIG 73 01/08/2022    CHOLHDL 1.9 01/08/2022   Lab Results  Component Value Date   CREATININE 0.99 01/08/2022   BUN 17 01/08/2022   NA 139 01/08/2022   K 4.8 01/08/2022   CL 104 01/08/2022   CO2 23 01/08/2022      Latest Ref Rng & Units 06/27/2021    4:07 PM 06/09/2021    4:44 AM 06/08/2021    2:56 PM  CBC  WBC 3.4 - 10.8 x10E3/uL 4.3  4.4  4.1   Hemoglobin 13.0 - 17.7 g/dL 11.0  9.8  9.4   Hematocrit 37.5 - 51.0 % 33.4  30.5  29.5   Platelets 150 - 450 x10E3/uL 333  260  216     Lab Results  Component Value Date   HGBA1C 5.6 05/10/2021   Lab Results  Component Value Date   TSH 1.160 06/08/2021    ================================================== I spent a total of 21 minutes with the patient spent in direct patient consultation.  Additional time spent with chart review  / charting (studies, outside notes, etc): 13 min Total Time: 34 min  Current medicines are reviewed at length with the patient today.  (+/- concerns) n/a  Notice: This dictation was prepared with Dragon dictation along with smart phrase technology. Any transcriptional errors that result from this process are unintentional and may not be corrected upon review.  Studies Ordered:   Orders Placed This Encounter  Procedures   Lipid panel   Comprehensive metabolic panel   Hemoglobin A1c   No orders of the defined types were placed in this encounter.   Patient Instructions / Medication Changes & Studies & Tests Ordered   Patient Instructions  Medication Instructions:   Wean off - Isosorbide ( Imdur) - stop taking  if chest pain returns restrat medicaiton   *If you need a refill on your cardiac medications before your next appointment, please call your pharmacy*   Lab Work: 6  months from now - May 2024 Lipid CMP hgbA1c If you have labs (blood work) drawn today and your tests are completely normal, you will receive your results only by: MyChart Message (if you have MyChart) OR A paper copy in the mail If you have  any lab test that is abnormal or we need to change your treatment, we will call you to review the results.   Testing/Procedures:  Not  needed  Follow-Up: At [facility], you and your health needs are our priority.  As part of our continuing mission to provide you with exceptional heart care, we have created designated Provider Care Teams.  These Care Teams include your primary Cardiologist (physician) and Advanced Practice Providers (APPs -  Physician Assistants and Nurse Practitioners) who all work together to provide you with the care you need, when you need it.     Your next appointment:   6 month(s)  The format for your next appointment:   In Person  Provider:   Coletta Memos, FNP    Then, Glenetta Hew, MD will plan to see you again in 12 month(s).   Other Instructions   Purchase a Blood pressure machine and check blood pressure  at least 3 to 4 times a  WEEK    BEFORE NEXT VISIT  CHECK YOUR BLOOD PRESSURE FOR 2 WEEKS PRIOR DAILY AND BRING INFORMATION TO THE  THE APPOINTMENT      Leonie Man, MD, MS Glenetta Hew, M.D., M.S. Interventional Cardiologist  [place] [place]  Pager # [phone] Phone # [phone] [address] [place], [place] [address]   Thank you for choosing [facility]!!

## 2022-04-28 ENCOUNTER — Encounter: Payer: Self-pay | Admitting: Cardiology

## 2022-04-28 NOTE — Assessment & Plan Note (Signed)
Moderate CAD with 1 small vessel occluded.  LDL continues to improve.  Most recently 66 as of August.  He is on 80 mg atorvastatin. Recheck labs by next visit.  If he continues to be his well-controlled, would probably reduce to 40 mg atorvastatin.

## 2022-04-28 NOTE — Assessment & Plan Note (Signed)
He really did have significant CAD.  Had microvascular disease.  Was on Plavix with the plan to continue for a year post infarct.  When he sees Austin Hawkins in follow-up 6 months for now, will stop Plavix and go back to aspirin 81 mg daily. Continue current dose of statin with well-controlled lipids along with beta-blocker and amlodipine.  If pressures increase we will increase amlodipine to 10 mg. Wean off Imdur.

## 2022-04-28 NOTE — Assessment & Plan Note (Signed)
Just about a year out from what sounds like an ACS presentation but no real obstructive disease besides a very small caliber vessel.  He had intermittent chest pain after that but has been doing well ever since.  Preserved EF. Doing well now.  No further chest pain/angina

## 2022-04-28 NOTE — Assessment & Plan Note (Signed)
Smoking cessation instruction/counseling given:  commended patient for quitting and reviewed strategies for preventing relapses 

## 2022-04-28 NOTE — Assessment & Plan Note (Addendum)
His blood pressures actually been a little up today he remains on amlodipine 5 mg daily and Toprol 50 mg daily.  I think we can wean off Imdur.  If pressures decline would probably increase amlodipine dose to 10 mg before adding any medicine.  Follow home BP 3-4 times a week leading up to PCP and APP follow-up visits.  This way we can know what his pressures are doing at home.

## 2022-06-06 ENCOUNTER — Other Ambulatory Visit: Payer: Self-pay | Admitting: Student

## 2022-09-18 LAB — HEMOGLOBIN A1C
Est. average glucose Bld gHb Est-mCnc: 120 mg/dL
Hgb A1c MFr Bld: 5.8 % — ABNORMAL HIGH (ref 4.8–5.6)

## 2022-09-18 LAB — COMPREHENSIVE METABOLIC PANEL
ALT: 15 IU/L (ref 0–44)
AST: 24 IU/L (ref 0–40)
Albumin/Globulin Ratio: 1.5 (ref 1.2–2.2)
Albumin: 4.1 g/dL (ref 3.9–4.9)
Alkaline Phosphatase: 41 IU/L — ABNORMAL LOW (ref 44–121)
BUN/Creatinine Ratio: 12 (ref 10–24)
BUN: 12 mg/dL (ref 8–27)
Bilirubin Total: 0.2 mg/dL (ref 0.0–1.2)
CO2: 23 mmol/L (ref 20–29)
Calcium: 9.2 mg/dL (ref 8.6–10.2)
Chloride: 101 mmol/L (ref 96–106)
Creatinine, Ser: 0.97 mg/dL (ref 0.76–1.27)
Globulin, Total: 2.8 g/dL (ref 1.5–4.5)
Glucose: 85 mg/dL (ref 70–99)
Potassium: 4.8 mmol/L (ref 3.5–5.2)
Sodium: 135 mmol/L (ref 134–144)
Total Protein: 6.9 g/dL (ref 6.0–8.5)
eGFR: 88 mL/min/{1.73_m2} (ref >59)

## 2022-09-18 LAB — LIPID PANEL
Chol/HDL Ratio: 3 ratio (ref 0.0–5.0)
Cholesterol, Total: 194 mg/dL (ref 100–199)
HDL: 64 mg/dL (ref >39)
LDL Chol Calc (NIH): 109 mg/dL — ABNORMAL HIGH (ref 0–99)
Triglycerides: 120 mg/dL (ref 0–149)
VLDL Cholesterol Cal: 21 mg/dL (ref 5–40)

## 2022-09-20 NOTE — Progress Notes (Signed)
Very unusual: Labs show slight increase in the A1c level up from 5.6 now 5.8. Chemistry panel looks pretty good with normal kidney and liver function. Also interesting is that the cholesterol level is changed dramatically.  The total Konrad Dolores all is gone up to 194 (from 126) and LDL is gone up to 109 (from 45).  Need to clarify what changed from medication standpoint.  I thought the plan was to reassess labs and potentially have him take 40 mg of atorvastatin instead of 80 mg.  If this is what is the present change, then we probably need to go back to the 80 mg versus switch to 40 mg rosuvastatin.  He is due to see Edd Fabian (although cannot see in a scheduled appointment) and a 46-month follow-up from the November visit.  Can discuss options at follow-up visit with Edd Fabian, NP.   Bryan Lemma, MD

## 2022-09-24 ENCOUNTER — Telehealth: Payer: Self-pay | Admitting: *Deleted

## 2022-09-24 NOTE — Telephone Encounter (Signed)
-----   Message from Marykay Lex, MD sent at 09/20/2022 12:11 AM EDT ----- Very unusual: Labs show slight increase in the A1c level up from 5.6 now 5.8. Chemistry panel looks pretty good with normal kidney and liver function. Also interesting is that the cholesterol level is changed dramatically.  The total Konrad Dolores all is gone up to 194 (from 126) and LDL is gone up to 109 (from 45).  Need to clarify what changed from medication standpoint.  I thought the plan was to reassess labs and potentially have him take 40 mg of atorvastatin instead of 80 mg.  If this is what is the present change, then we probably need to go back to the 80 mg versus switch to 40 mg rosuvastatin.  He is due to see Edd Fabian (although cannot see in a scheduled appointment) and a 14-month follow-up from the November visit.  Can discuss options at follow-up visit with Edd Fabian, NP.   Bryan Lemma, MD

## 2022-09-24 NOTE — Telephone Encounter (Signed)
Called patient to left voicemail to call back for result and set up 6 month appt with Gwendolyn Lima NP  ( per recall list)

## 2022-10-09 ENCOUNTER — Encounter: Payer: Self-pay | Admitting: *Deleted

## 2022-10-09 NOTE — Telephone Encounter (Signed)
Left message to call to make an appointment and give results.  Letter mailed with information

## 2022-11-06 IMAGING — CR DG CHEST 2V
2 series · 2 of 2 positions shown · non-contrast
Comparison: 05/08/2021

CLINICAL DATA: Chest pain

EXAM:
CHEST - 2 VIEW

[chest pa]
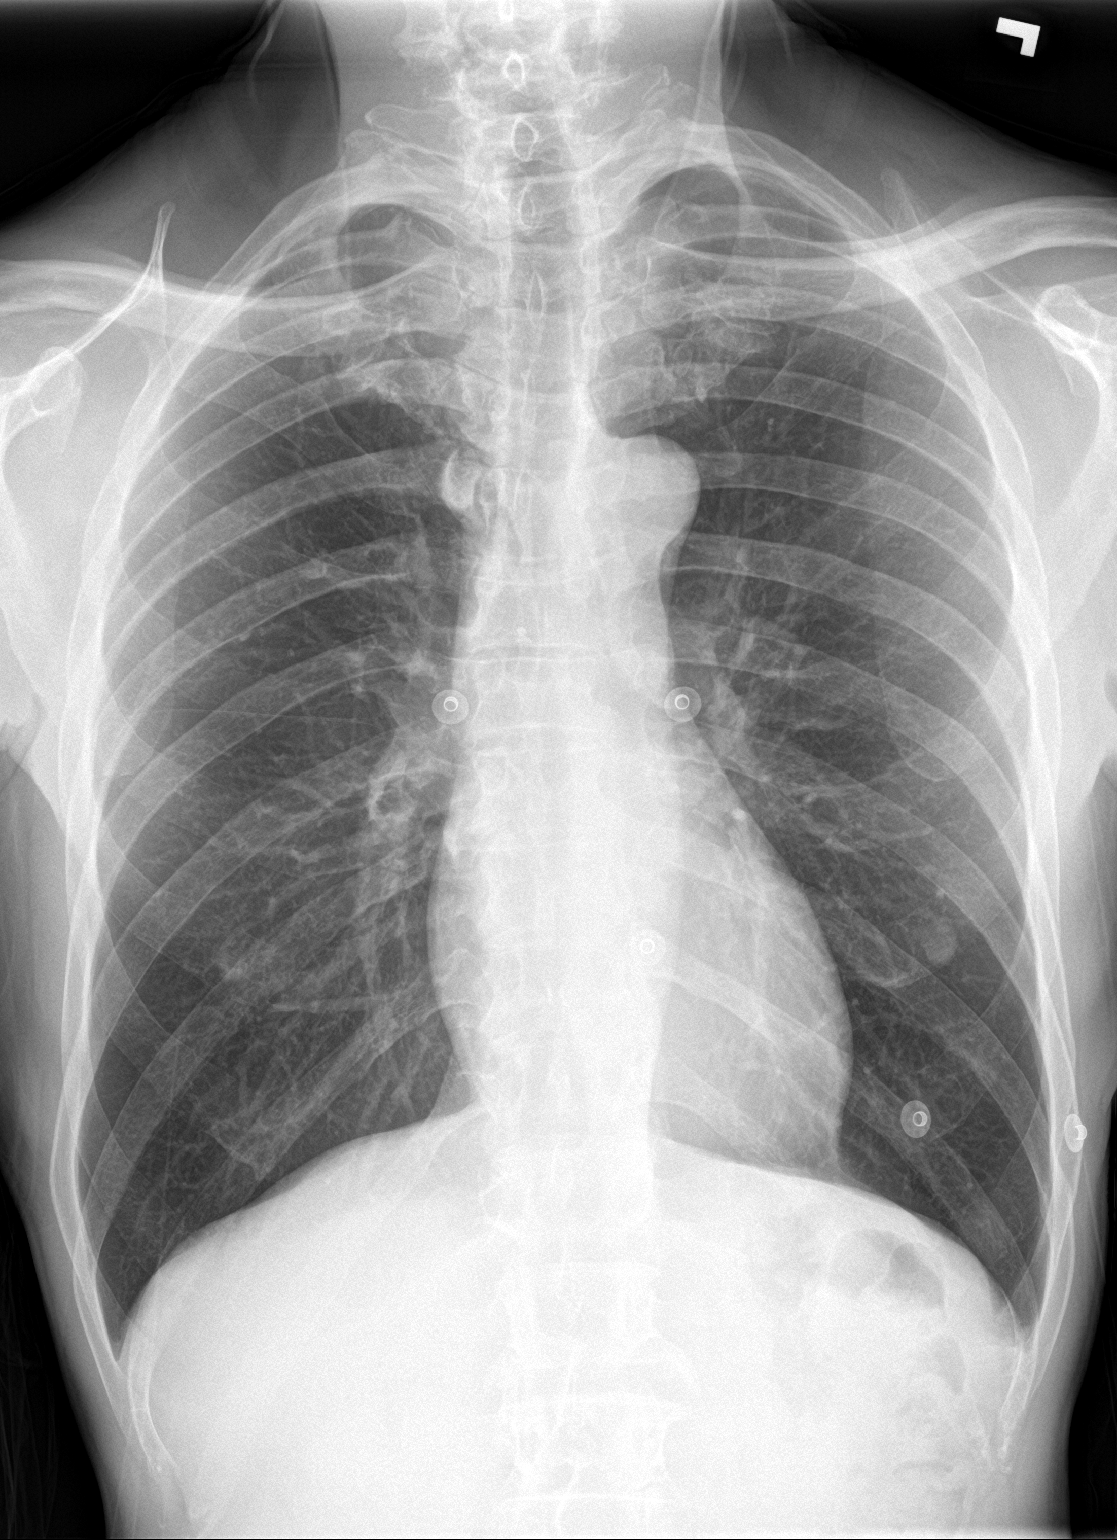

[chest lat]
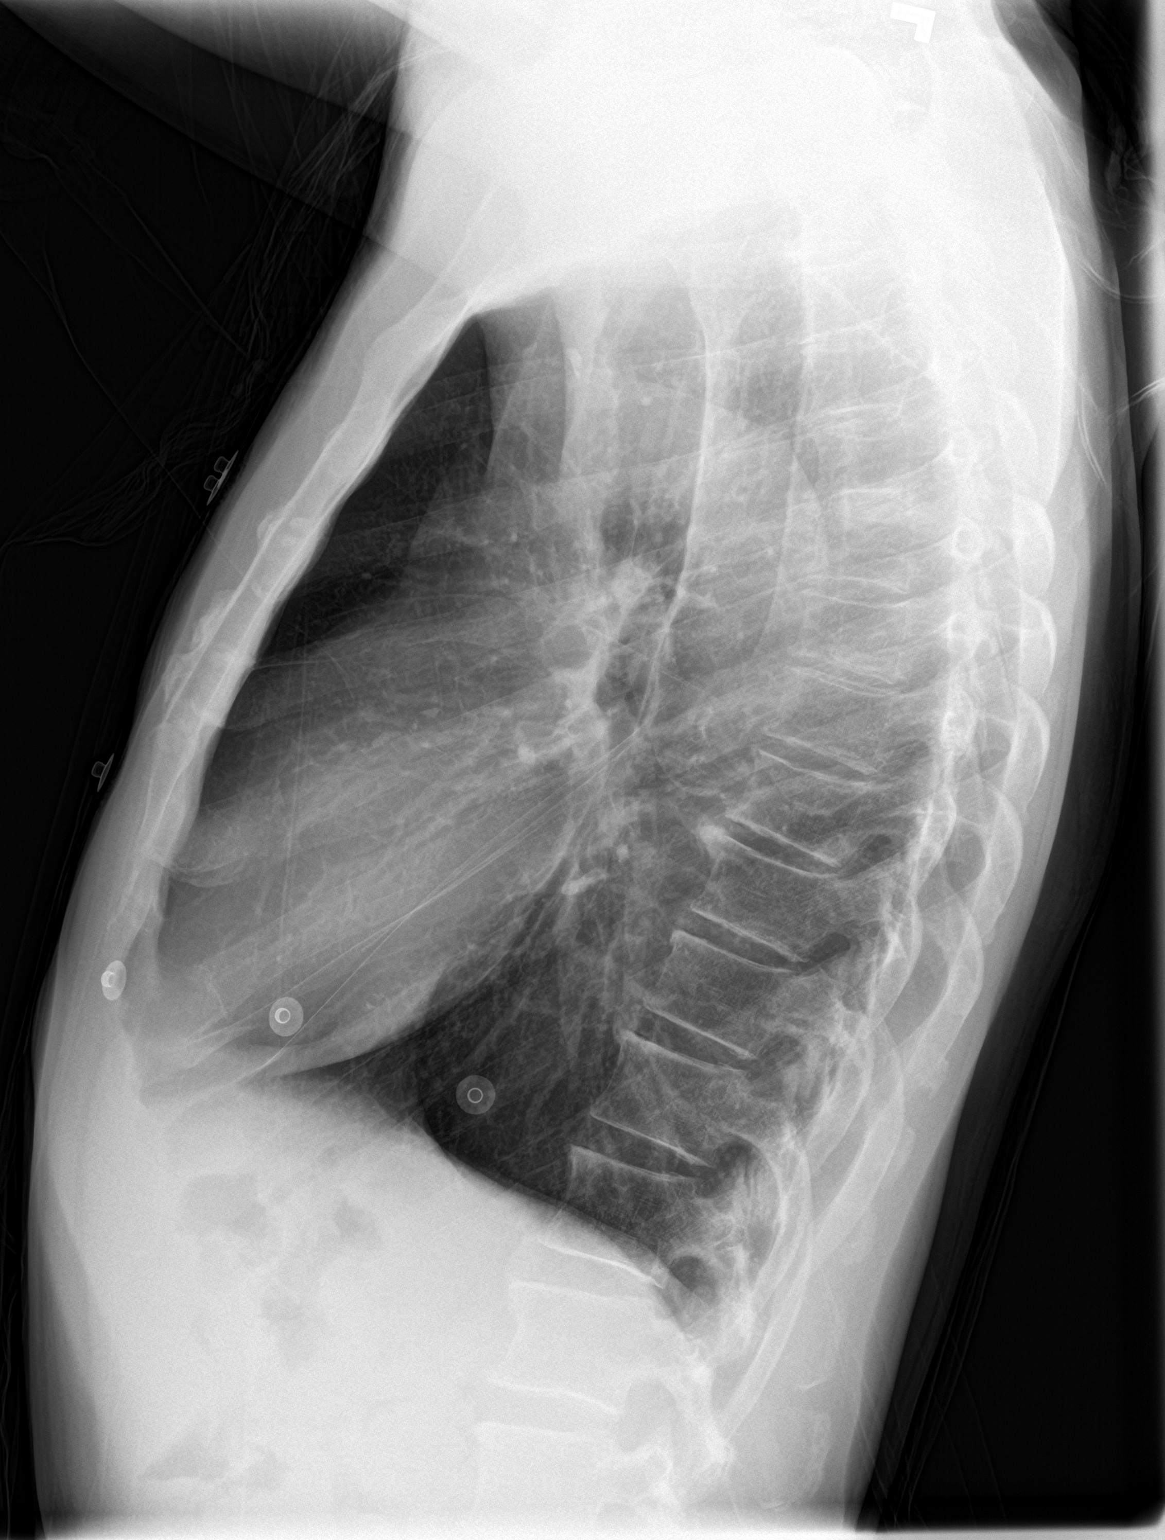

[2 of 2 positions shown; findings below may reference images not displayed]

FINDINGS: Cardiac shadow is within normal limits. Lungs are well aerated
bilaterally. No focal infiltrate or sizable effusion is seen.
Bilateral nipple shadows are again noted. No bony abnormality is
seen.
IMPRESSION: No acute abnormality noted.

## 2022-11-20 IMAGING — DX DG CHEST 2V
2 series · 2 of 2 positions shown · non-contrast
Comparison: May 25, 2021

CLINICAL DATA: Chest pain

EXAM:
CHEST - 2 VIEW

[chest pa]
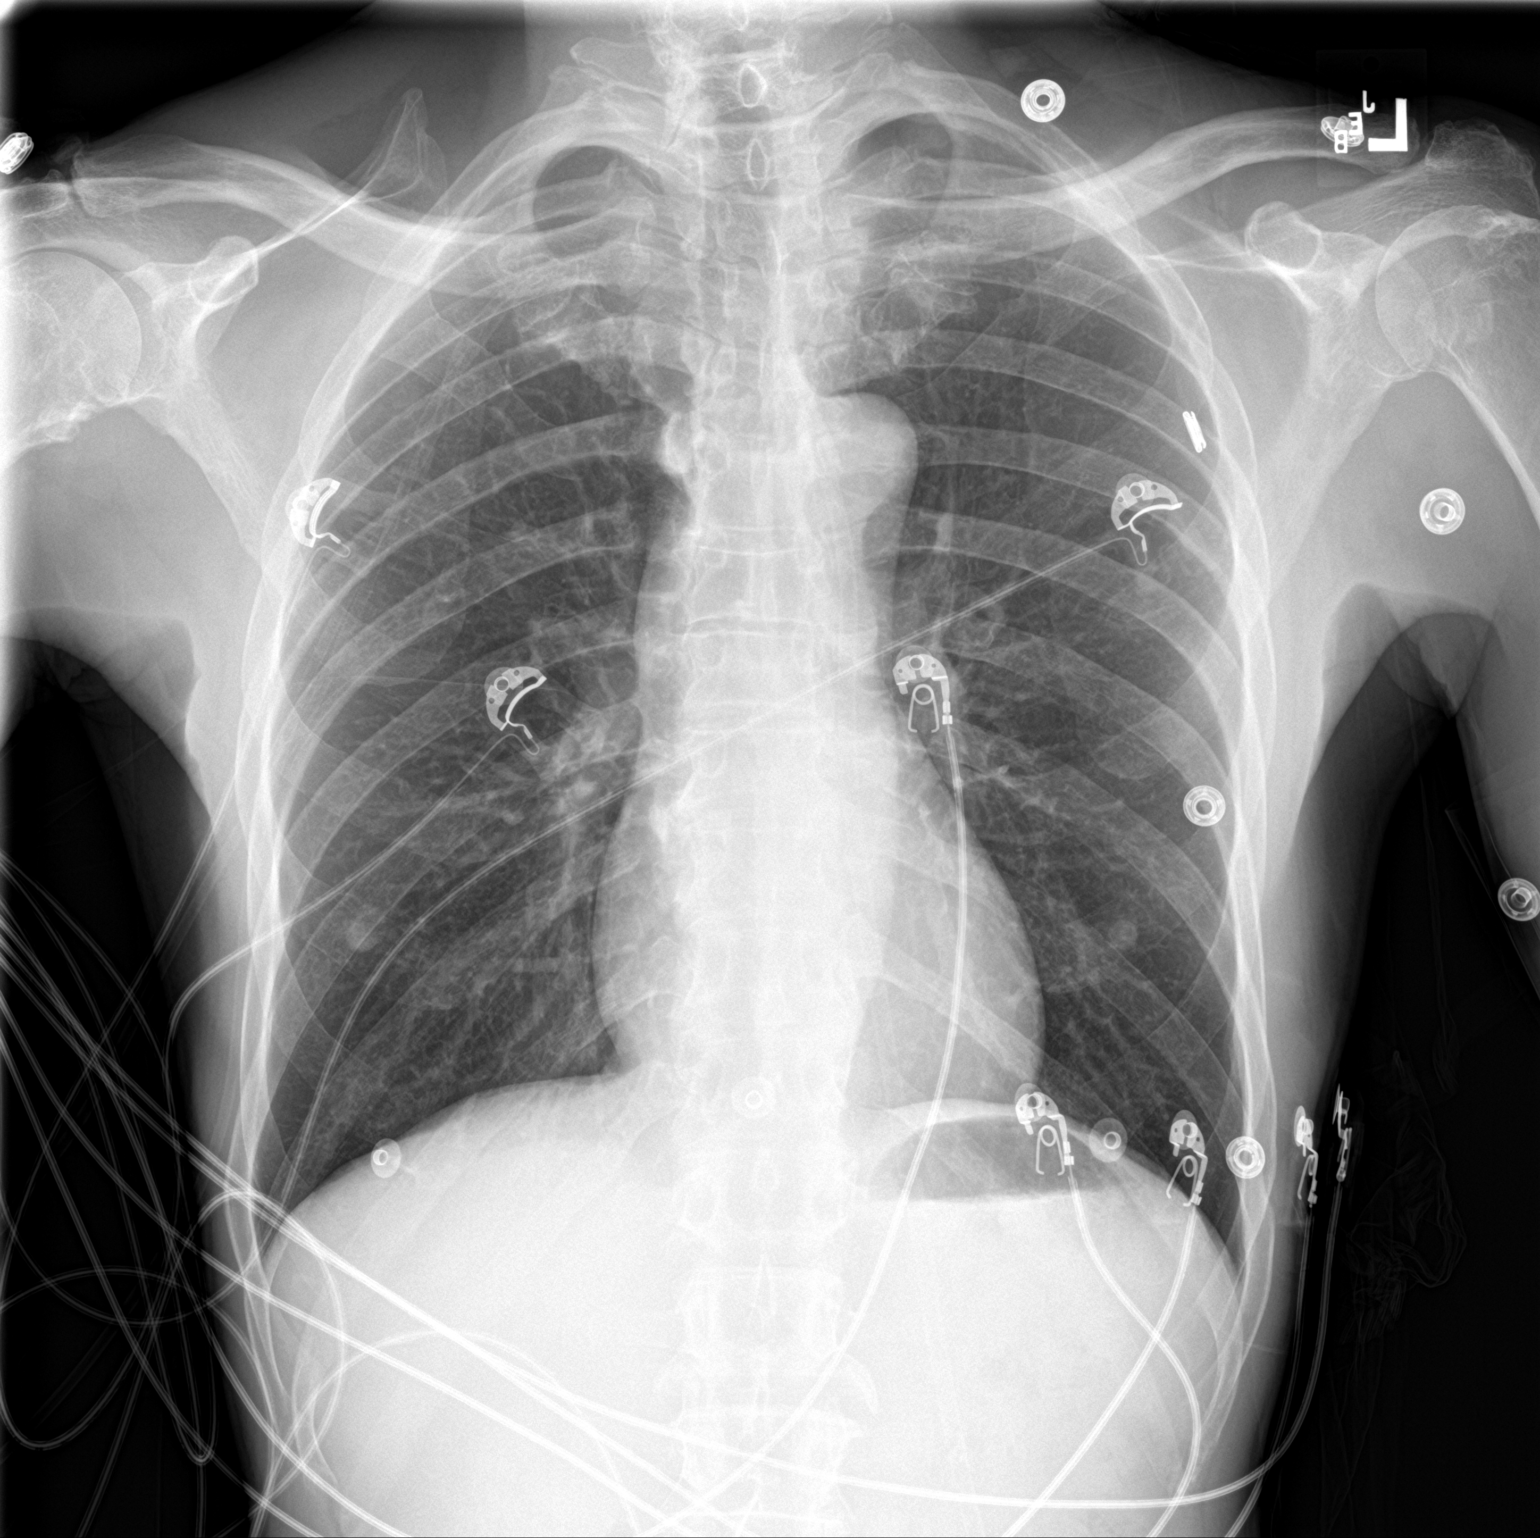

[chest lat]
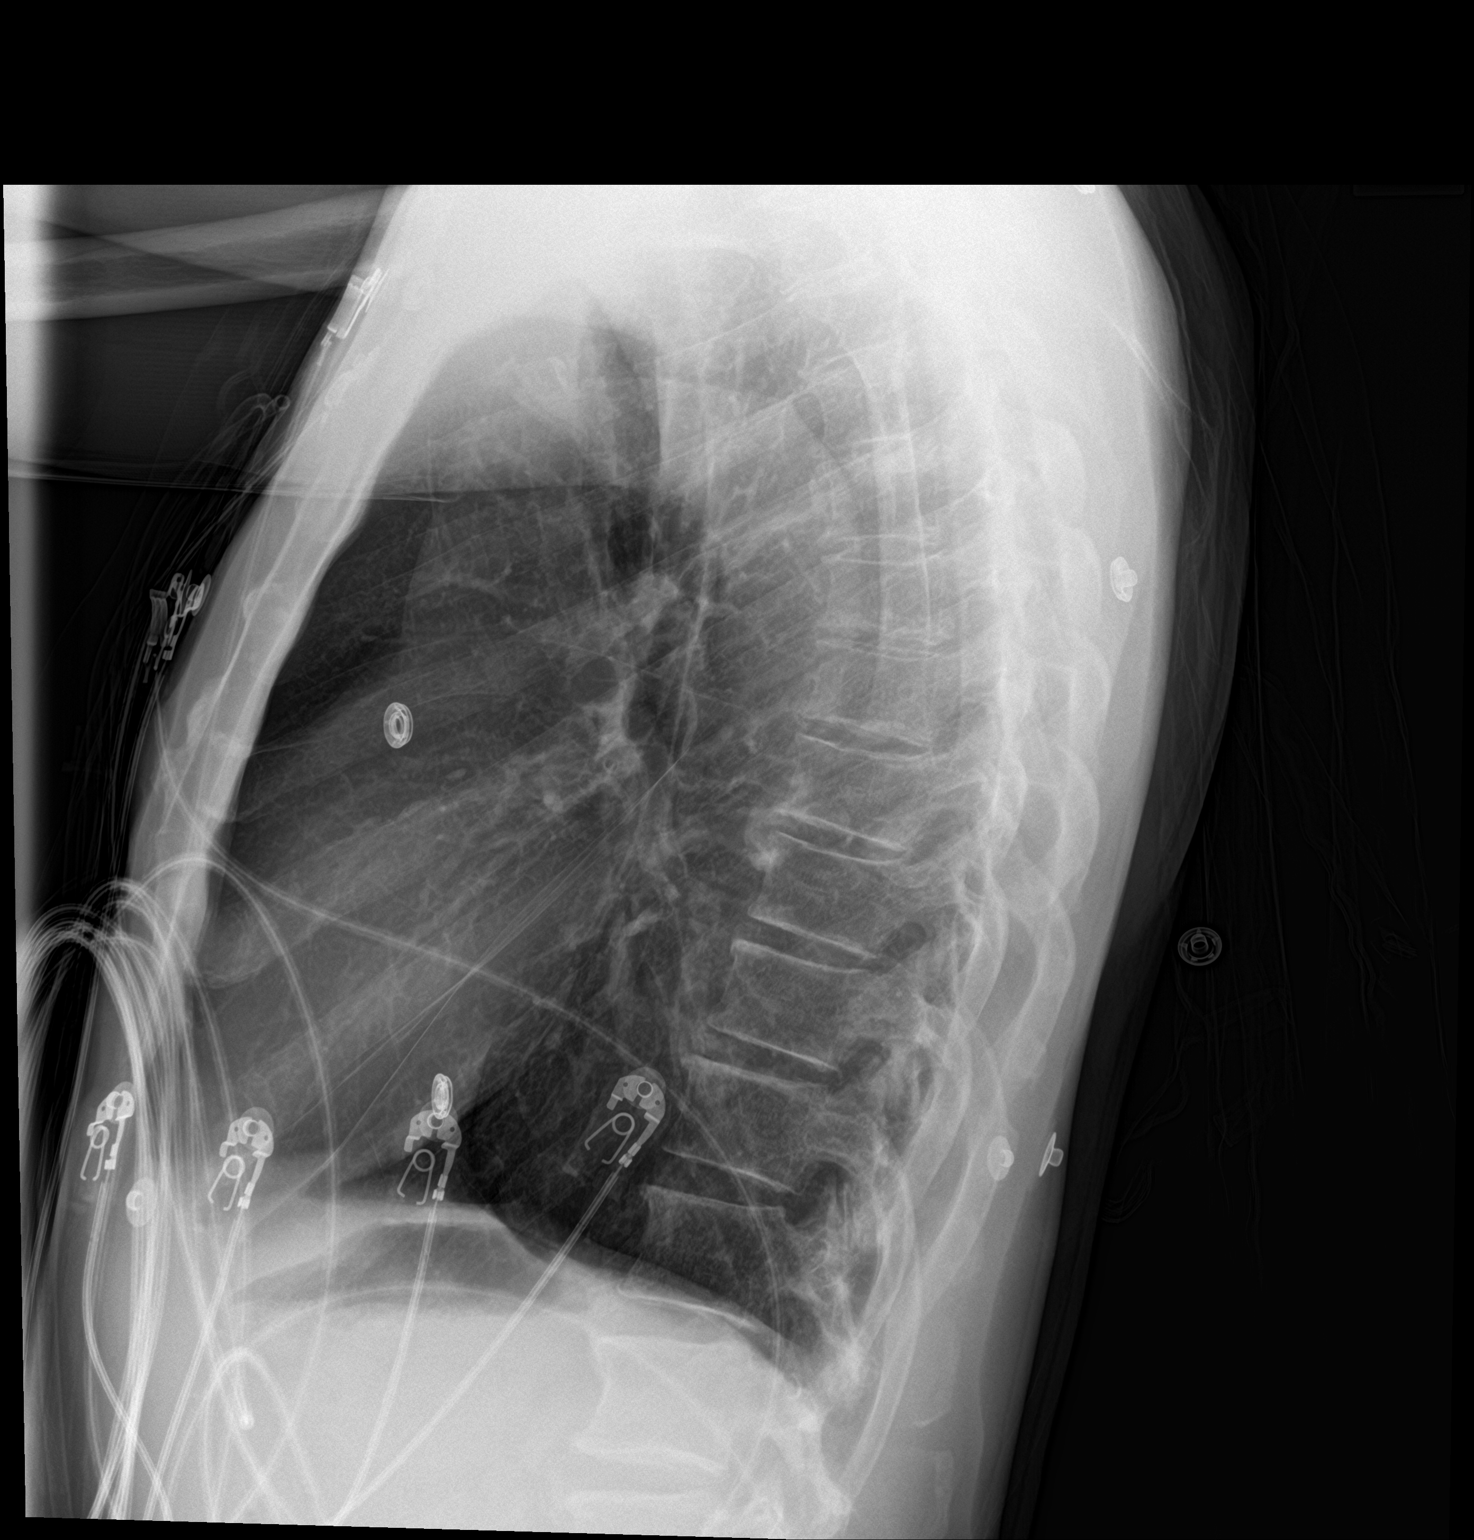

[2 of 2 positions shown; findings below may reference images not displayed]

FINDINGS: The heart size and mediastinal contours are within normal limits.
Nodularity projecting over the bilateral lung bases likely reflect
nipple shadows. No focal airspace consolidation. No pleural effusion
or pneumothorax. The visualized skeletal structures are unchanged.
IMPRESSION: No active cardiopulmonary disease.

Nodularity projecting over the bilateral lung bases likely reflect
nipple shadows.

## 2023-02-21 ENCOUNTER — Other Ambulatory Visit: Payer: Self-pay | Admitting: Cardiology

## 2023-03-29 ENCOUNTER — Other Ambulatory Visit: Payer: Self-pay | Admitting: Cardiology

## 2023-04-02 NOTE — Progress Notes (Unsigned)
Cardiology Clinic Note   Patient Name: Austin Hawkins Date of Encounter: 04/08/2023  Primary Care Provider:  Pcp, No Primary Cardiologist:  Bryan Lemma, MD  Patient Profile     Austin Hawkins 62 year old male presents the clinic today for follow-up evaluation of his coronary artery disease and essential hypertension.  Past Medical History    Past Medical History:  Diagnosis Date   Allergy    Aortic atherosclerosis (HCC)    CAD (coronary artery disease) 04/2021   NSTEMI-RCA 12%, mid LAD 30%.  OM 2 (less than 1 mm vessel) occluded.   Cancer [facility])    Prostate   COPD (chronic obstructive pulmonary disease) (HCC)    not yet formally diagnosed but seen on CXR 04/2021   Essential hypertension    Hyperlipidemia    Prostate cancer (HCC)    Tobacco use    Past Surgical History:  Procedure Laterality Date   LEFT HEART CATH AND CORONARY ANGIOGRAPHY N/A 05/09/2021   Procedure: LEFT HEART CATH AND CORONARY ANGIOGRAPHY;  Surgeon: Kathleene Hazel, MD;  Location: [facility] INVASIVE CV LAB;: Mid RCA lesion is 20% stenosed.   Mid LAD lesion is 30% stenosed.   2nd Mrg (<1 mm vessel) lesion is 100% stenosed. (culprit)   ROBOT ASSISTED LAPAROSCOPIC RADICAL PROSTATECTOMY Bilateral 01/08/2017   Procedure: XI ROBOTIC ASSISTED LAPAROSCOPIC RADICAL PROSTATECTOMY WITH BILATERAL PELVIC  NODE  DISSECTON;  Surgeon: Crist Fat, MD;  Location: [facility] ORS;  Service: Urology;  Laterality: Bilateral;   TRANSTHORACIC ECHOCARDIOGRAM  05/09/2021   LVEF 60 to 65%.  No RWMA.  GR 1 DD.  Normal RV size and function.  Normal MV.  Normal AOV.  Normal RAP.    Allergies  Allergies  Allergen Reactions   Wool Alcohol [Lanolin] Hives and Swelling    History of Present Illness     Austin Hawkins has a PMH of NSTEMI, HTN, coronary disease, idiopathic hematuria, prostate CA, HLD, elevated LFTs, and anemia.  He underwent cardiac catheterization 12/22 and was noted to have a small OM 2 artery which was 100%  occluded, (CTO) it was not amenable to PCI.  He was seen in follow-up by Dr. Herbie Baltimore on 04/08/2022.  During that time he remained stable from a cardiac standpoint.  He was walking several days per week.  He was eating well and maintaining hydration to avoid dizzy episodes.  He was avoiding sugar and sodas.  He did note occasional episodes of dizziness with bending over.  He asked whether he needed to take his PPI.  His GI symptoms did stabilize.  He continues to take half dose of his Imdur.  A 7-month follow-up was planned.  He presents to the clinic today for follow-up evaluation and states he has been doing well.  He did have 1 episode where he presented to the hospital after inhaling smoke at his place of work.  He reports that he stayed in the hospital for couple days and was not impacted by smoke inhalation.  We reviewed his previous cardiac catheterization and echocardiogram.  He expressed understanding.  His EKG today shows normal sinus rhythm 64 bpm.  We reviewed his atorvastatin intolerance.  I will start him on rosuvastatin and repeat his fasting lipids and LFTs in 8 weeks.  Will plan follow-up in 1 year.  I will give him the high-fiber diet information.  Today he denies chest pain, shortness of breath, lower extremity edema, fatigue, palpitations, melena, hematuria, hemoptysis, diaphoresis, weakness, presyncope, syncope, orthopnea, and PND.  Home Medications    Prior to Admission medications   Medication Sig Start Date End Date Taking? Authorizing Provider  amLODipine (NORVASC) 5 MG tablet Take 5 mg by mouth daily.    [provider]  atorvastatin (LIPITOR) 80 MG tablet Take 80 mg by mouth daily. 09/16/21   [provider]  clopidogrel (PLAVIX) 75 MG tablet Take 1 tablet by mouth once daily 02/21/23   Marykay Lex, MD  isosorbide mononitrate (IMDUR) 30 MG 24 hr tablet Take 1 tablet (30 mg total) by mouth daily. 06/10/21   Kroeger, Ovidio Kin., PA-C  metoprolol succinate  (TOPROL-XL) 50 MG 24 hr tablet TAKE 1 TABLET BY MOUTH ONCE DAILY WITH OR IMMEDIATELY FOLLOWING A MEAL 03/31/23   Marykay Lex, MD  nitroGLYCERIN (NITROSTAT) 0.4 MG SL tablet DISSOLVE ONE TABLET UNDER THE TONGUE EVERY 5 MINUTES AS NEEDED FOR CHEST PAIN.  DO NOT EXCEED A TOTAL OF 3 DOSES IN 15 MINUTES 06/06/22   Marykay Lex, MD  pantoprazole (PROTONIX) 40 MG tablet Take 1 tablet (40 mg total) by mouth daily. 05/10/21 10/01/21  Marjie Skiff E, PA-C  triamcinolone ointment (KENALOG) 0.1 % Apply 1 application topically in the morning and at bedtime. Uses on legs 04/24/21   [provider]    Family History    Family History  Problem Relation Age of Onset   Coronary artery disease Mother    Stroke Father    Coronary artery disease Sister    He indicated that his mother is deceased. He indicated that his father is deceased. He indicated that the status of his sister is unknown.  Social History    Social History   Socioeconomic History   Marital status: Single    Spouse name: Not on file   Number of children: Not on file   Years of education: Not on file   Highest education level: Not on file  Occupational History   Not on file  Tobacco Use   Smoking status: Former    Current packs/day: 1.00    Types: Cigarettes   Smokeless tobacco: Never  Substance and Sexual Activity   Alcohol use: Not Currently    Comment: former use   Drug use: Not Currently    Types: Marijuana    Comment: none currently   Sexual activity: Not on file  Other Topics Concern   Not on file  Social History Narrative   Not on file   Social Determinants of Health   Financial Resource Strain: Not on file  Food Insecurity: Not on file  Transportation Needs: Not on file  Physical Activity: Not on file  Stress: Not on file  Social Connections: Not on file  Intimate Partner Violence: Not on file     Review of Systems    General:  No chills, fever, night sweats or weight changes.   Cardiovascular:  No chest pain, dyspnea on exertion, edema, orthopnea, palpitations, paroxysmal nocturnal dyspnea. Dermatological: No rash, lesions/masses Respiratory: No cough, dyspnea Urologic: No hematuria, dysuria Abdominal:   No nausea, vomiting, diarrhea, bright red blood per rectum, melena, or hematemesis Neurologic:  No visual changes, wkns, changes in mental status. All other systems reviewed and are otherwise negative except as noted above.  Physical Exam    VS:  BP 118/80   Pulse 64   Ht 5\' 7"  (1.702 m)   Wt 119 lb (54 kg)   SpO2 97%   BMI 18.64 kg/m  , BMI Body mass index is 18.64 kg/m.  GEN: Well nourished, well developed, in no acute distress. HEENT: normal. Neck: Supple, no JVD, carotid bruits, or masses. Cardiac: RRR, no murmurs, rubs, or gallops. No clubbing, cyanosis, edema.  Radials/DP/PT 2+ and equal bilaterally.  Respiratory:  Respirations regular and unlabored, clear to auscultation bilaterally. GI: Soft, nontender, nondistended, BS + x 4. MS: no deformity or atrophy. Skin: warm and dry, no rash. Neuro:  Strength and sensation are intact. Psych: Normal affect.  Accessory Clinical Findings    Recent Labs: 09/17/2022: ALT 15; BUN 12; Creatinine, Ser 0.97; Potassium 4.8; Sodium 135   Recent Lipid Panel    Component Value Date/Time   CHOL 194 09/17/2022 0811   TRIG 120 09/17/2022 0811   HDL 64 09/17/2022 0811   CHOLHDL 3.0 09/17/2022 0811   CHOLHDL 2.6 05/10/2021 0139   VLDL 16 05/10/2021 0139   LDLCALC 109 (H) 09/17/2022 0811         ECG personally reviewed by me today- EKG Interpretation Date/Time:  Tuesday April 08 2023 07:59:41 EST Ventricular Rate:  64 PR Interval:  142 QRS Duration:  70 QT Interval:  368 QTC Calculation: 379 R Axis:   45  Text Interpretation: Normal sinus rhythm Possible Inferior infarct , age undetermined When compared with ECG of 08-Jun-2021 14:46, PREVIOUS ECG IS PRESENT Confirmed by Edd Fabian [phone]) on  04/08/2023 8:06:32 AM   Echocardiogram 05/09/2021  IMPRESSIONS     1. Left ventricular ejection fraction, by estimation, is 60 to 65%. The  left ventricle has normal function. The left ventricle has no regional  wall motion abnormalities. Left ventricular diastolic parameters are  consistent with Grade I diastolic  dysfunction (impaired relaxation).   2. Right ventricular systolic function is normal. The right ventricular  size is normal.   3. The mitral valve is normal in structure. No evidence of mitral valve  regurgitation. No evidence of mitral stenosis.   4. The aortic valve is tricuspid. Aortic valve regurgitation is not  visualized. No aortic stenosis is present.   5. The inferior vena cava is normal in size with greater than 50%  respiratory variability, suggesting right atrial pressure of 3 mmHg.   FINDINGS   Left Ventricle: Left ventricular ejection fraction, by estimation, is 60  to 65%. The left ventricle has normal function. The left ventricle has no  regional wall motion abnormalities. The left ventricular internal cavity  size was normal in size. There is   no left ventricular hypertrophy. Left ventricular diastolic parameters  are consistent with Grade I diastolic dysfunction (impaired relaxation).  Indeterminate filling pressures.   Right Ventricle: The right ventricular size is normal. No increase in  right ventricular wall thickness. Right ventricular systolic function is  normal.   Left Atrium: Left atrial size was normal in size.   Right Atrium: Right atrial size was normal in size.   Pericardium: There is no evidence of pericardial effusion.   Mitral Valve: The mitral valve is normal in structure. No evidence of  mitral valve regurgitation. No evidence of mitral valve stenosis.   Tricuspid Valve: The tricuspid valve is normal in structure. Tricuspid  valve regurgitation is trivial. No evidence of tricuspid stenosis.   Aortic Valve: The aortic valve  is tricuspid. Aortic valve regurgitation is  not visualized. No aortic stenosis is present.   Pulmonic Valve: The pulmonic valve was normal in structure. Pulmonic valve  regurgitation is not visualized. No evidence of pulmonic stenosis.   Aorta: The aortic root is normal in size and structure.  Venous: The inferior vena cava is normal in size with greater than 50%  respiratory variability, suggesting right atrial pressure of 3 mmHg.   IAS/Shunts: No atrial level shunt detected by color flow Doppler.    Cardiac catheterization 05/09/2021    Mid RCA lesion is 20% stenosed.   Mid LAD lesion is 30% stenosed.   2nd Mrg lesion is 100% stenosed.   Mild non-obstructive, calcified mid LAD stenosis The Circumflex and ramus intermediate branch are patent. There is a very small second obtuse marginal branch (.75 mm) that appears to be occluded.  The large dominant RCA has mild mid vessel calcified stenosis.  Normal LV filling pressure   Recommendations: He has no obstructive disease in the major epicardial vessels. There is a very small caliber obtuse marginal branch that appears to be occluded. I have reviewed the cath films with the IC team and we are in agreement that there is no lesions that are amenable to PCI. Will continue ASA, statin and beta blocker. I will load with Plavix 300 mg today and then start 75 mg daily. I would continue DAPT for one year. Continue Norvasc. Check D-dimer.   Diagnostic Dominance: Right Left Anterior Descending  Vessel is large.  Mid LAD lesion is 30% stenosed. The lesion is calcified.    Left Circumflex  Vessel is large.    Second Obtuse Marginal Branch  Vessel is small in size.  2nd Mrg lesion is 100% stenosed.    Right Coronary Artery  Vessel is large.  Mid RCA lesion is 20% stenosed. The lesion is calcified.    Intervention   No interventions have been documented.   Coronary Diagrams  Diagnostic Dominance:  Right  Intervention   Assessment & Plan   1.  Coronary artery disease-denies recent episodes of chest discomfort.  Underwent LHC on 12/22.  He was noted to have CTO of his OM 2.  Medical management was recommended.  Maintaining physical activity. Heart healthy low-sodium diet Continue amlodipine, metoprolol, atorvastatin, aspirin Maintain physical activity  Hyperlipidemia-LDL 109 on 09/17/2022.  Did not tolerate atorvastatin due to itching type feeling. High-fiber diet Continue aspirin,  Start Crestor 20 mg daily Order fasting lipids and LFTs in 8 weeks  Essential hypertension-BP today 118/80. Maintain blood pressure log Continue current medical therapy  Tobacco abuse-tobacco cessation strongly encouraged. Currently smoking about 3 cigarettes/day  Disposition: Follow-up with Dr. Herbie Baltimore or me in 12 months.   Thomasene Ripple. Redell Nazir NP-C     04/08/2023, 8:07 AM [facility] [address] Office [phone] Fax [phone]    I spent 14 minutes examining this patient, reviewing medications, and using patient centered shared decision making involving her cardiac care.   I spent greater than 20 minutes reviewing her past medical history,  medications, and prior cardiac tests.

## 2023-04-08 ENCOUNTER — Ambulatory Visit: Payer: 59 | Attending: General Practice | Admitting: General Practice

## 2023-04-08 ENCOUNTER — Encounter: Payer: Self-pay | Admitting: General Practice

## 2023-04-08 VITALS — BP 118/80 | HR 64 | Ht 67.0 in | Wt 119.0 lb

## 2023-04-08 DIAGNOSIS — I10 Essential (primary) hypertension: Secondary | ICD-10-CM | POA: Diagnosis not present

## 2023-04-08 DIAGNOSIS — E785 Hyperlipidemia, unspecified: Secondary | ICD-10-CM

## 2023-04-08 DIAGNOSIS — Z720 Tobacco use: Secondary | ICD-10-CM

## 2023-04-08 DIAGNOSIS — I2510 Atherosclerotic heart disease of native coronary artery without angina pectoris: Secondary | ICD-10-CM

## 2023-04-08 MED ORDER — ROSUVASTATIN CALCIUM 20 MG PO TABS
20.0000 mg | ORAL_TABLET | Freq: Every day | ORAL | 3 refills | Status: DC
Start: 2023-04-08 — End: 2023-08-14

## 2023-04-08 NOTE — Patient Instructions (Signed)
Medication Instructions:  START ROSUVASTATIN (CRESTOR) 20MG  DAILY *If you need a refill on your cardiac medications before your next appointment, please call your pharmacy*  Lab Work: FASTING LIPID AND LFT IN 8 WEEKS~06-03-23 If you have labs (blood work) drawn today and your tests are completely normal, you will receive your results only by:  MyChart Message (if you have MyChart) OR  A paper copy in the mail If you have any lab test that is abnormal or we need to change your treatment, we will call you to review the results.  Other Instructions PLEASE READ AND FOLLOW ATTACHED INCREASED FIBER DIET  Follow-Up: At [facility], you and your health needs are our priority.  As part of our continuing mission to provide you with exceptional heart care, we have created designated Provider Care Teams.  These Care Teams include your primary Cardiologist (physician) and Advanced Practice Providers (APPs -  Physician Assistants and Nurse Practitioners) who all work together to provide you with the care you need, when you need it.  Your next appointment:   1 year(s)  Provider:   Bryan Lemma, MD      High-Fiber Eating Plan Fiber, also called dietary fiber, is found in foods such as fruits, vegetables, whole grains, and beans. A high-fiber diet can be good for your health. Your health care provider may recommend a high-fiber diet to help: Prevent trouble pooping (constipation). Lower your cholesterol. Treat the following conditions: Hemorrhoids. This is inflammation of veins in the anus. Inflammation of specific areas of the digestive tract. Irritable bowel syndrome (IBS). This is a problem of the large intestine, also called the colon, that sometimes causes belly pain and bloating. Prevent overeating as part of a weight-loss plan. Lower the risk of heart disease, type 2 diabetes, and certain cancers. What are tips for following this plan? Reading food labels  Check the nutrition  facts label on foods for the amount of dietary fiber. Choose foods that have 4 grams of fiber or more per serving. The recommended goals for how much fiber you should eat each day include: Males 27 years old or younger: 30-34 g. Males over 70 years old: 28-34 g. Females 56 years old or younger: 25-28 g. Females over 107 years old: 22-25 g. Your daily fiber goal is _____________ g. Shopping Choose whole fruits and vegetables instead of processed. For example, choose apples instead of apple juice or applesauce. Choose a variety of high-fiber foods such as avocados, lentils, oats, and pinto beans. Read the nutrition facts label on foods. Check for foods with added fiber. These foods often have high sugar and salt (sodium) amounts per serving. Cooking Use whole-grain flour for baking and cooking. Cook with brown rice instead of white rice. Make meals that have a lot of beans and vegetables in them, such as chili or vegetable-based soups. Meal planning Start the day with a breakfast that is high in fiber, such as a cereal that has 5 g of fiber or more per serving. Eat breads and cereals that are made with whole-grain flour instead of refined flour or white flour. Eat brown rice, bulgur wheat, or millet instead of white rice. Use beans in place of meat in soups, salads, and pasta dishes. Be sure that half of the grains you eat each day are whole grains. General information You can get the recommended amount of dietary fiber by: Eating a variety of fruits, vegetables, grains, nuts, and beans. Taking a fiber supplement if you aren't able to  eat enough fiber. It's better to get fiber through food than from a supplement. Slowly increase how much fiber you eat. If you increase the amount of fiber you eat too quickly, you may have bloating, cramping, or gas. Drink plenty of water to help you digest fiber. Choose high-fiber snacks, such as berries, raw vegetables, nuts, and popcorn. What foods should I  eat? Fruits Berries. Pears. Apples. Oranges. Avocado. Prunes and raisins. Dried figs. Vegetables Sweet potatoes. Spinach. Kale. Artichokes. Cabbage. Broccoli. Cauliflower. Green peas. Carrots. Squash. Grains Whole-grain breads. Multigrain cereal. Oats and oatmeal. Brown rice. Barley. Bulgur wheat. Millet. Quinoa. Bran muffins. Popcorn. Rye wafer crackers. Meats and other proteins Navy beans, kidney beans, and pinto beans. Soybeans. Split peas. Lentils. Nuts and seeds. Dairy Fiber-fortified yogurt. Fortified means that fiber has been added to the product. Beverages Fiber-fortified soy milk. Fiber-fortified orange juice. Other foods Fiber bars. The items listed above may not be all the foods and drinks you can have. Talk to a dietitian to learn more. What foods should I avoid? Fruits Fruit juice. Cooked, strained fruit. Vegetables Fried potatoes. Canned vegetables. Well-cooked vegetables. Grains White bread. Pasta made with refined flour. White rice. Meats and other proteins Fatty meat. Fried chicken or fried fish. Dairy Milk. Cream cheese. Sour cream. Fats and oils Butters. Beverages Soft drinks. Other foods Cakes and pastries. The items listed above may not be all the foods and drinks you should avoid. Talk to a dietitian to learn more. This information is not intended to replace advice given to you by your health care provider. Make sure you discuss any questions you have with your health care provider. Document Revised: 07/22/2022 Document Reviewed: 07/22/2022 Elsevier Patient Education  2024 [employer].

## 2023-04-09 ENCOUNTER — Other Ambulatory Visit: Payer: Self-pay | Admitting: Cardiology

## 2023-05-08 ENCOUNTER — Other Ambulatory Visit: Payer: Self-pay | Admitting: Cardiology

## 2023-06-03 LAB — LIPID PANEL
Chol/HDL Ratio: 2 {ratio} (ref 0.0–5.0)
Cholesterol, Total: 150 mg/dL (ref 100–199)
HDL: 75 mg/dL (ref >39)
LDL Chol Calc (NIH): 58 mg/dL (ref 0–99)
Triglycerides: 91 mg/dL (ref 0–149)
VLDL Cholesterol Cal: 17 mg/dL (ref 5–40)

## 2023-06-03 LAB — HEPATIC FUNCTION PANEL
ALT: 25 [IU]/L (ref 0–44)
AST: 33 [IU]/L (ref 0–40)
Albumin: 4.5 g/dL (ref 3.9–4.9)
Alkaline Phosphatase: 42 [IU]/L — ABNORMAL LOW (ref 44–121)
Bilirubin Total: 0.4 mg/dL (ref 0.0–1.2)
Bilirubin, Direct: 0.16 mg/dL (ref 0.00–0.40)
Total Protein: 7.4 g/dL (ref 6.0–8.5)

## 2023-08-02 ENCOUNTER — Other Ambulatory Visit: Payer: Self-pay | Admitting: Cardiology

## 2023-08-04 ENCOUNTER — Other Ambulatory Visit (HOSPITAL_COMMUNITY): Payer: Self-pay

## 2023-08-14 ENCOUNTER — Other Ambulatory Visit: Payer: Self-pay | Admitting: General Practice

## 2024-04-14 ENCOUNTER — Encounter: Payer: Self-pay | Admitting: Cardiology

## 2024-04-14 ENCOUNTER — Ambulatory Visit: Attending: Cardiology | Admitting: Cardiology

## 2024-04-14 ENCOUNTER — Other Ambulatory Visit: Payer: Self-pay | Admitting: *Deleted

## 2024-04-14 VITALS — BP 153/71 | HR 63 | Ht 67.0 in | Wt 119.0 lb

## 2024-04-14 DIAGNOSIS — I214 Non-ST elevation (NSTEMI) myocardial infarction: Secondary | ICD-10-CM | POA: Diagnosis not present

## 2024-04-14 DIAGNOSIS — I10 Essential (primary) hypertension: Secondary | ICD-10-CM

## 2024-04-14 DIAGNOSIS — E785 Hyperlipidemia, unspecified: Secondary | ICD-10-CM | POA: Diagnosis not present

## 2024-04-14 DIAGNOSIS — I2510 Atherosclerotic heart disease of native coronary artery without angina pectoris: Secondary | ICD-10-CM | POA: Diagnosis not present

## 2024-04-14 DIAGNOSIS — Z720 Tobacco use: Secondary | ICD-10-CM

## 2024-04-14 MED ORDER — VALSARTAN-HYDROCHLOROTHIAZIDE 80-12.5 MG PO TABS: 0.5000 | ORAL_TABLET | Freq: Every day | ORAL | 3 refills | Status: AC

## 2024-04-14 MED ORDER — NITROGLYCERIN 0.4 MG SL SUBL: 0.4000 mg | SUBLINGUAL_TABLET | SUBLINGUAL | 3 refills | Status: AC | PRN

## 2024-04-14 NOTE — Progress Notes (Signed)
 Cardiology Office Note:  .   Date:  04/19/2024  ID:  Austin Hawkins, DOB 03-Mar-1961, MRN [id number] PCP: Freddrick Johns  [facility] Providers Cardiologist:  Alm Clay, MD     No chief complaint on file.   Patient Profile: .     Austin Hawkins is a relatively healthy-appearing 63 y.o. male former smoker with a PMH notable for CAD (non-STEMI-small OM 2 occluded-no PCI, medical management), HTN, HLD, COPD who presents here for annual follow-up at the request of No ref. provider found.  PMH: Past Medical History:  Diagnosis Date   Allergy    Aortic atherosclerosis    CAD (coronary artery disease) 04/2021   NSTEMI-RCA 12%, mid LAD 30%.  OM 2 (less than 1 mm vessel) occluded.   Cancer [facility])    Prostate   COPD (chronic obstructive pulmonary disease) (HCC)    not yet formally diagnosed but seen on CXR 04/2021   Essential hypertension    Hyperlipidemia    Prostate cancer (HCC)    Tobacco use       Austin Hawkins was last seen on April 08, 2023 by Josefa Beauvais, NP indicating he was doing very well.  He had 1 episode where he had smoking elation at his place of work and spent some time in the hospital but had not any other symptoms.  No further symptoms of angina or heart failure.  Noted intolerance to atorvastatin  so he converted to rosuvastatin  20 mg daily.  Subjective  Discussed the use of AI scribe software for clinical note transcription with the patient, who gave verbal consent to proceed.  History of Present Illness He is a 63 year old male with coronary artery disease, hypertension, and hyperlipidemia who presents for follow-up after a heart attack in December 2022.  Since his heart attack in December 2022, he has not experienced severe chest pain, pressure, or tightness. He occasionally experiences mild symptoms during physical activity, but these are infrequent and not concerning to him. No shortness of breath, palpitations, or dizziness.  He has a history of  coronary artery disease and recalls that during his previous heart procedure in December 2022, there was a small side branch that was not able to be opened. His current medications include rosuvastatin  20 mg, metoprolol  succinate 50 mg, and clopidogrel  75 mg. He was previously on atorvastatin  but switched to rosuvastatin  due to itching. He is no longer taking amlodipine  or Protonix .  He experiences cramps in his legs and feet, particularly at night or when turning in bed, but denies any muscle aches or cramping during activity.  His last cholesterol levels were checked in January, and he has not been on a cholesterol pill for over a year. He is currently taking rosuvastatin .  He has a history of smoking inhalation at work but reports no ongoing issues related to this. He smokes occasionally but not routinely.  He does not have a consistent primary care provider and [facility] doctors. His last visit was in September, and he has a follow-up scheduled for later this month.    Objective   Medications: Metoprolol  succinate 50 mL daily, rosuvastatin  20 daily, clopidogrel  75 mg daily. - No longer on amlodipine  5 mg daily or Imdur  30 midodrine daily.  Studies Reviewed: SABRA   EKG Interpretation Date/Time:  Wednesday April 14 2024 10:41:57 EST Ventricular Rate:  63 PR Interval:  158 QRS Duration:  64 QT Interval:  370 QTC Calculation: 378 R Axis:  55  Text Interpretation: Normal sinus rhythm Normal ECG When compared with ECG of 08-Apr-2023 07:59, Borderline criteria for Inferior infarct are no longer Present ST elevation now present in Inferior leads T wave inversion no longer evident in Inferior leads Confirmed by Anner Lenis ([id number]) on 04/14/2024 11:39:11 AM   Lab Results  Component Value Date   CHOL 150 06/03/2023   HDL 75 06/03/2023   LDLCALC 58 06/03/2023   TRIG 91 06/03/2023   CHOLHDL 2.0 06/03/2023   Lab Results  Component Value Date   NA  135 09/17/2022   K 4.8 09/17/2022   CREATININE 0.97 09/17/2022   EGFR 88 09/17/2022   GLUCOSE 85 09/17/2022   Lab Results  Component Value Date   WBC 4.3 06/27/2021   HGB 11.0 (L) 06/27/2021   HCT 33.4 (L) 06/27/2021   MCV 88 06/27/2021   PLT 333 06/27/2021   Prior Cardiac Studies ECHO: EF 60-65%, Nor RWMA. Gr 1 DD. Normal RV. Normal RVSP & RAP. Normal Valves. (04/2021) CATH: Culprit 100% small OM2; mid LAD 30%, mid RCa 20%.  Normal LVEDP => Med Rx (04/2021)    Risk Assessment/Calculations:     HYPERTENSION CONTROL Vitals:   04/14/24 1035   BP: (!) 153/71 (!) 146/68    The patient's blood pressure is elevated above target today.  In order to address the patient's elevated BP: A new medication was prescribed today. (Added valsartan  HCTZ 40-12.5 mg daily)      Physical Exam:   VS:  BP (!) 146/68   Pulse 63   Ht 5' 7 (1.702 m)   Wt 119 lb (54 kg)   SpO2 98%   BMI 18.64 kg/m    Wt Readings from Last 3 Encounters:  04/14/24 119 lb (54 kg)  04/08/23 119 lb (54 kg)  04/08/22 126 lb 12.8 oz (57.5 kg)     GEN: Well nourished, well groomed in no acute distress; healthy-appearing NECK: No JVD; No carotid bruits CARDIAC: Normal S1, S2; RRR, no murmurs, rubs, gallops RESPIRATORY:  Clear to auscultation without rales, wheezing or rhonchi ; nonlabored, good air movement. ABDOMEN: Soft, non-tender, non-distended EXTREMITIES:  No edema; No deformity     ASSESSMENT AND PLAN: .    Problem List Items Addressed This Visit       Cardiology Problems   Coronary artery disease involving native coronary artery of native heart without angina pectoris (Chronic)   No further anginal symptoms.Small vessel coronary artery disease, asymptomatic. Occasional exertional symptoms, less frequent. Three years post-MI, no stent needed. - Switched from clopidogrel  to aspirin  after current prescription is finished. - Continue rosuvastatin  20 mg daily along with Toprol  50 Miller daily and add  valsartan -HCTZ 40-12.5 mg daily      Relevant Medications   valsartan -hydrochlorothiazide  (DIOVAN -HCT) 80-12.5 MG tablet   nitroGLYCERIN  (NITROSTAT ) 0.4 MG SL tablet   Other Relevant Orders   Comprehensive metabolic panel with GFR   Lipid panel   Essential hypertension - Primary (Chronic)   Blood pressure elevated. Previously on amlodipine , not currently taking it.  Antihypertensive medication reintroduction needed. - Continue Toprol  50 midodrine daily - Added valsartan  HCTZ 80-12.5 mg daily, to manage blood pressure.  (He can sorrowful 1/2 tablet)      Relevant Medications   valsartan -hydrochlorothiazide  (DIOVAN -HCT) 80-12.5 MG tablet   nitroGLYCERIN  (NITROSTAT ) 0.4 MG SL tablet   Other Relevant Orders   EKG 12-Lead (Completed)   Comprehensive metabolic panel with GFR   Lipid panel   Hyperlipidemia with target LDL less  than 70 (Chronic)   On rosuvastatin  20 mg. Atorvastatin  discontinued due to itching. Cholesterol levels need re-evaluation post-medication change. - Continue rosuvastatin  20 mg - Will check fasting lipid panel in two weeks to assess cholesterol levels.      Relevant Medications   valsartan -hydrochlorothiazide  (DIOVAN -HCT) 80-12.5 MG tablet   nitroGLYCERIN  (NITROSTAT ) 0.4 MG SL tablet   NSTEMI (non-ST elevated myocardial infarction) (HCC) (Chronic)   3 years out from what sounds like an ACS presentation and elevated troponin.  No real obstructive CAD noted.  Maybe a small caliber OM1 branch occluded but otherwise no significant disease. No further symptoms.      Relevant Medications   valsartan -hydrochlorothiazide  (DIOVAN -HCT) 80-12.5 MG tablet   nitroGLYCERIN  (NITROSTAT ) 0.4 MG SL tablet     Other   Tobacco abuse (Chronic)   He quit smoking over a year ago.  Doing well.  Congratulated his efforts.      Other Visit Diagnoses       Hyperlipidemia LDL goal <70       Relevant Medications   valsartan -hydrochlorothiazide  (DIOVAN -HCT) 80-12.5 MG tablet    nitroGLYCERIN  (NITROSTAT ) 0.4 MG SL tablet   Other Relevant Orders   Comprehensive metabolic panel with GFR   Lipid panel            Follow-Up: Return in about 1 year (around 04/14/2025) for Alternating annual follow-ups APP and MD.     Signed, Alm MICAEL Clay, MD, MS Alm Clay, M.D., M.S. Interventional Cardiologist  [facility] Pager # [phone]

## 2024-04-14 NOTE — Patient Instructions (Signed)
 Medication Instructions:    Take 1/2 tablet 18f Valsartan -hydrochlorothiazide- 80/12.5 mg daily    *If you need a refill on your cardiac medications before your next appointment, please call your pharmacy*   Lab Work: fasting  CMP LIPID  If you have labs (blood work) drawn today and your tests are completely normal, you will receive your results only by: MyChart Message (if you have MyChart) OR A paper copy in the mail If you have any lab test that is abnormal or we need to change your treatment, we will call you to review the results.   Testing/Procedures:    Follow-Up: At [facility], you and your health needs are our priority.  As part of our continuing mission to provide you with exceptional heart care, we have created designated Provider Care Teams.  These Care Teams include your primary Cardiologist (physician) and Advanced Practice Providers (APPs -  Physician Assistants and Nurse Practitioners) who all work together to provide you with the care you need, when you need it.     Your next appointment:   12 month(s)  The format for your next appointment:   In Person  Provider:   Josefa Beauvais, NP      Then, Alm Clay, MD will plan to see you again in 24 month(s).   Other Instructions

## 2024-04-19 ENCOUNTER — Encounter: Payer: Self-pay | Admitting: Cardiology

## 2024-04-19 NOTE — Assessment & Plan Note (Signed)
 He quit smoking over a year ago.  Doing well.  Congratulated his efforts.

## 2024-04-19 NOTE — Assessment & Plan Note (Addendum)
 On rosuvastatin  20 mg. Atorvastatin  discontinued due to itching. Cholesterol levels need re-evaluation post-medication change. - Continue rosuvastatin  20 mg - Will check fasting lipid panel in two weeks to assess cholesterol levels.

## 2024-04-19 NOTE — Assessment & Plan Note (Signed)
 Blood pressure elevated. Previously on amlodipine , not currently taking it.  Antihypertensive medication reintroduction needed. - Continue Toprol  50 midodrine daily - Added valsartan  HCTZ 80-12.5 mg daily, to manage blood pressure.  (He can sorrowful 1/2 tablet)

## 2024-04-19 NOTE — Assessment & Plan Note (Signed)
 3 years out from what sounds like an ACS presentation and elevated troponin.  No real obstructive CAD noted.  Maybe a small caliber OM1 branch occluded but otherwise no significant disease. No further symptoms.

## 2024-04-19 NOTE — Assessment & Plan Note (Signed)
 No further anginal symptoms.Small vessel coronary artery disease, asymptomatic. Occasional exertional symptoms, less frequent. Three years post-MI, no stent needed. - Switched from clopidogrel  to aspirin  after current prescription is finished. - Continue rosuvastatin  20 mg daily along with Toprol  50 Miller daily and add valsartan -HCTZ 40-12.5 mg daily

## 2024-04-30 LAB — LIPID PANEL
Chol/HDL Ratio: 2 ratio (ref 0.0–5.0)
Cholesterol, Total: 137 mg/dL (ref 100–199)
HDL: 67 mg/dL
LDL Chol Calc (NIH): 54 mg/dL (ref 0–99)
Triglycerides: 82 mg/dL (ref 0–149)
VLDL Cholesterol Cal: 16 mg/dL (ref 5–40)

## 2024-04-30 LAB — COMPREHENSIVE METABOLIC PANEL WITH GFR
ALT: 32 IU/L (ref 0–44)
AST: 41 IU/L — ABNORMAL HIGH (ref 0–40)
Albumin: 4.5 g/dL (ref 3.9–4.9)
Alkaline Phosphatase: 51 IU/L (ref 47–123)
BUN/Creatinine Ratio: 20 (ref 10–24)
BUN: 21 mg/dL (ref 8–27)
Bilirubin Total: 0.4 mg/dL (ref 0.0–1.2)
CO2: 22 mmol/L (ref 20–29)
Calcium: 9.1 mg/dL (ref 8.6–10.2)
Chloride: 102 mmol/L (ref 96–106)
Creatinine, Ser: 1.05 mg/dL (ref 0.76–1.27)
Globulin, Total: 3.2 g/dL (ref 1.5–4.5)
Glucose: 80 mg/dL (ref 70–99)
Potassium: 4.8 mmol/L (ref 3.5–5.2)
Sodium: 137 mmol/L (ref 134–144)
Total Protein: 7.7 g/dL (ref 6.0–8.5)
eGFR: 79 mL/min/1.73

## 2024-05-07 ENCOUNTER — Ambulatory Visit: Payer: Self-pay | Admitting: Cardiology

## 2024-05-10 ENCOUNTER — Other Ambulatory Visit: Payer: Self-pay | Admitting: General Practice
